# Patient Record
Sex: Female | Born: 1940 | Race: White | Hispanic: No
Health system: Southern US, Community
[De-identification: ages and names within clinical notes are randomized; demographics above are authoritative.]

## PROBLEM LIST (undated history)

## (undated) DIAGNOSIS — J449 Chronic obstructive pulmonary disease, unspecified: Secondary | ICD-10-CM

## (undated) DIAGNOSIS — J9621 Acute and chronic respiratory failure with hypoxia: Secondary | ICD-10-CM

## (undated) DIAGNOSIS — J181 Lobar pneumonia, unspecified organism: Secondary | ICD-10-CM

## (undated) DIAGNOSIS — N183 Chronic kidney disease, stage 3 unspecified: Secondary | ICD-10-CM

---

## 2019-03-07 ENCOUNTER — Inpatient Hospital Stay
Admission: RE | Admit: 2019-03-07 | Discharge: 2019-04-04 | Disposition: A | Discharge status: Home Health | Payer: Medicare Other | Source: Other Acute Inpatient Hospital | Attending: Internal Medicine | Admitting: Internal Medicine

## 2019-03-07 ENCOUNTER — Other Ambulatory Visit (HOSPITAL_COMMUNITY): Payer: Medicare Other

## 2019-03-07 DIAGNOSIS — N183 Chronic kidney disease, stage 3 unspecified: Secondary | ICD-10-CM | POA: Diagnosis present

## 2019-03-07 DIAGNOSIS — T85598A Other mechanical complication of other gastrointestinal prosthetic devices, implants and grafts, initial encounter: Secondary | ICD-10-CM

## 2019-03-07 DIAGNOSIS — Z0189 Encounter for other specified special examinations: Secondary | ICD-10-CM

## 2019-03-07 DIAGNOSIS — J449 Chronic obstructive pulmonary disease, unspecified: Secondary | ICD-10-CM | POA: Diagnosis present

## 2019-03-07 DIAGNOSIS — J9690 Respiratory failure, unspecified, unspecified whether with hypoxia or hypercapnia: Secondary | ICD-10-CM

## 2019-03-07 DIAGNOSIS — J9621 Acute and chronic respiratory failure with hypoxia: Secondary | ICD-10-CM | POA: Diagnosis present

## 2019-03-07 DIAGNOSIS — K567 Ileus, unspecified: Secondary | ICD-10-CM

## 2019-03-07 DIAGNOSIS — J181 Lobar pneumonia, unspecified organism: Secondary | ICD-10-CM | POA: Diagnosis present

## 2019-03-07 HISTORY — DX: Chronic kidney disease, stage 3 unspecified: N18.30

## 2019-03-07 HISTORY — DX: Acute and chronic respiratory failure with hypoxia: J96.21

## 2019-03-07 HISTORY — DX: Lobar pneumonia, unspecified organism: J18.1

## 2019-03-07 HISTORY — DX: Chronic obstructive pulmonary disease, unspecified: J44.9

## 2019-03-07 MED ORDER — METHYLPREDNISOLONE SODIUM SUCC 40 MG IJ SOLR
40.00 | INTRAMUSCULAR | Status: DC
Start: 2019-03-08 — End: 2019-03-07

## 2019-03-07 MED ORDER — GENERIC EXTERNAL MEDICATION
Status: DC
Start: ? — End: 2019-03-07

## 2019-03-07 MED ORDER — ALBUTEROL SULFATE (2.5 MG/3ML) 0.083% IN NEBU
2.50 | INHALATION_SOLUTION | RESPIRATORY_TRACT | Status: DC
Start: 2019-03-07 — End: 2019-03-07

## 2019-03-07 MED ORDER — ENOXAPARIN SODIUM 40 MG/0.4ML ~~LOC~~ SOLN
40.00 | SUBCUTANEOUS | Status: DC
Start: ? — End: 2019-03-07

## 2019-03-07 MED ORDER — SODIUM BICARBONATE 650 MG PO TABS
650.00 | ORAL_TABLET | ORAL | Status: DC
Start: 2019-03-07 — End: 2019-03-07

## 2019-03-07 MED ORDER — GENERIC EXTERNAL MEDICATION
0.08 | Status: DC
Start: ? — End: 2019-03-07

## 2019-03-07 MED ORDER — BISACODYL 10 MG RE SUPP
10.00 | RECTAL | Status: DC
Start: 2019-03-07 — End: 2019-03-07

## 2019-03-07 MED ORDER — POLYETHYLENE GLYCOL 3350 17 G PO PACK
17.00 | PACK | ORAL | Status: DC
Start: ? — End: 2019-03-07

## 2019-03-07 MED ORDER — GABAPENTIN 100 MG PO CAPS
200.00 | ORAL_CAPSULE | ORAL | Status: DC
Start: 2019-03-07 — End: 2019-03-07

## 2019-03-07 MED ORDER — BUDESONIDE 0.5 MG/2ML IN SUSP
0.50 | RESPIRATORY_TRACT | Status: DC
Start: 2019-03-07 — End: 2019-03-07

## 2019-03-07 MED ORDER — SODIUM CHLORIDE 0.9 % IV SOLN
10.00 | INTRAVENOUS | Status: DC
Start: ? — End: 2019-03-07

## 2019-03-07 MED ORDER — ACETAMINOPHEN 650 MG RE SUPP
650.00 | RECTAL | Status: DC
Start: ? — End: 2019-03-07

## 2019-03-07 MED ORDER — LEVOTHYROXINE SODIUM 100 MCG PO TABS
100.00 | ORAL_TABLET | ORAL | Status: DC
Start: 2019-03-08 — End: 2019-03-07

## 2019-03-07 MED ORDER — FENTANYL CITRATE-NACL 1-0.9 MG/100ML-% IV SOLN
50.00 | INTRAVENOUS | Status: DC
Start: ? — End: 2019-03-07

## 2019-03-07 MED ORDER — ALBUTEROL SULFATE (2.5 MG/3ML) 0.083% IN NEBU
2.50 | INHALATION_SOLUTION | RESPIRATORY_TRACT | Status: DC
Start: ? — End: 2019-03-07

## 2019-03-07 MED ORDER — ARFORMOTEROL TARTRATE 15 MCG/2ML IN NEBU
15.00 | INHALATION_SOLUTION | RESPIRATORY_TRACT | Status: DC
Start: 2019-03-07 — End: 2019-03-07

## 2019-03-07 MED ORDER — GENERIC EXTERNAL MEDICATION
1.00 | Status: DC
Start: 2019-03-07 — End: 2019-03-07

## 2019-03-07 MED ORDER — GENERIC EXTERNAL MEDICATION
0.04 | Status: DC
Start: ? — End: 2019-03-07

## 2019-03-07 MED ORDER — ACETAMINOPHEN 160 MG/5ML PO LIQD
650.00 | ORAL | Status: DC
Start: ? — End: 2019-03-07

## 2019-03-07 MED ORDER — PAROXETINE HCL 10 MG PO TABS
10.00 | ORAL_TABLET | ORAL | Status: DC
Start: 2019-03-08 — End: 2019-03-07

## 2019-03-07 MED ORDER — MUPIROCIN 2 % EX OINT
TOPICAL_OINTMENT | CUTANEOUS | Status: DC
Start: 2019-03-07 — End: 2019-03-07

## 2019-03-07 MED ORDER — ACETAMINOPHEN 325 MG PO TABS
650.00 | ORAL_TABLET | ORAL | Status: DC
Start: ? — End: 2019-03-07

## 2019-03-07 MED ORDER — PROPOFOL 200 MG/20ML IV EMUL
5.00 | INTRAVENOUS | Status: DC
Start: ? — End: 2019-03-07

## 2019-03-08 ENCOUNTER — Other Ambulatory Visit (HOSPITAL_COMMUNITY): Payer: Medicare Other

## 2019-03-08 ENCOUNTER — Encounter: Payer: Self-pay | Admitting: Internal Medicine

## 2019-03-08 DIAGNOSIS — J449 Chronic obstructive pulmonary disease, unspecified: Secondary | ICD-10-CM | POA: Diagnosis present

## 2019-03-08 DIAGNOSIS — J181 Lobar pneumonia, unspecified organism: Secondary | ICD-10-CM

## 2019-03-08 DIAGNOSIS — J9621 Acute and chronic respiratory failure with hypoxia: Secondary | ICD-10-CM | POA: Diagnosis not present

## 2019-03-08 DIAGNOSIS — N183 Chronic kidney disease, stage 3 unspecified: Secondary | ICD-10-CM | POA: Diagnosis present

## 2019-03-08 LAB — CBC WITH DIFFERENTIAL/PLATELET
Abs Immature Granulocytes: 0.1 10*3/uL — ABNORMAL HIGH (ref 0.00–0.07)
Basophils Absolute: 0 10*3/uL (ref 0.0–0.1)
Basophils Relative: 0 %
Eosinophils Absolute: 0 10*3/uL (ref 0.0–0.5)
Eosinophils Relative: 0 %
HCT: 25.1 % — ABNORMAL LOW (ref 36.0–46.0)
Hemoglobin: 7.8 g/dL — ABNORMAL LOW (ref 12.0–15.0)
Lymphocytes Relative: 10 %
Lymphs Abs: 0.5 10*3/uL — ABNORMAL LOW (ref 0.7–4.0)
MCH: 30.5 pg (ref 26.0–34.0)
MCHC: 31.1 g/dL (ref 30.0–36.0)
MCV: 98 fL (ref 80.0–100.0)
Metamyelocytes Relative: 1 %
Monocytes Absolute: 0.1 10*3/uL (ref 0.1–1.0)
Monocytes Relative: 1 %
Neutro Abs: 4.6 10*3/uL (ref 1.7–7.7)
Neutrophils Relative %: 88 %
Platelets: 134 10*3/uL — ABNORMAL LOW (ref 150–400)
RBC: 2.56 MIL/uL — ABNORMAL LOW (ref 3.87–5.11)
RDW: 13.6 % (ref 11.5–15.5)
WBC: 5.2 10*3/uL (ref 4.0–10.5)
nRBC: 0.8 % — ABNORMAL HIGH (ref 0.0–0.2)
nRBC: 4 /100 WBC — ABNORMAL HIGH

## 2019-03-08 LAB — COMPREHENSIVE METABOLIC PANEL
ALT: 18 U/L (ref 0–44)
AST: 13 U/L — ABNORMAL LOW (ref 15–41)
Albumin: 2.6 g/dL — ABNORMAL LOW (ref 3.5–5.0)
Alkaline Phosphatase: 34 U/L — ABNORMAL LOW (ref 38–126)
Anion gap: 6 (ref 5–15)
BUN: 59 mg/dL — ABNORMAL HIGH (ref 8–23)
CO2: 20 mmol/L — ABNORMAL LOW (ref 22–32)
Calcium: 8.7 mg/dL — ABNORMAL LOW (ref 8.9–10.3)
Chloride: 115 mmol/L — ABNORMAL HIGH (ref 98–111)
Creatinine, Ser: 1.8 mg/dL — ABNORMAL HIGH (ref 0.44–1.00)
GFR calc Af Amer: 31 mL/min — ABNORMAL LOW (ref >60)
GFR calc non Af Amer: 26 mL/min — ABNORMAL LOW (ref >60)
Glucose, Bld: 110 mg/dL — ABNORMAL HIGH (ref 70–99)
Potassium: 5.2 mmol/L — ABNORMAL HIGH (ref 3.5–5.1)
Sodium: 141 mmol/L (ref 135–145)
Total Bilirubin: 0.4 mg/dL (ref 0.3–1.2)
Total Protein: 5.2 g/dL — ABNORMAL LOW (ref 6.5–8.1)

## 2019-03-08 LAB — BLOOD GAS, ARTERIAL
Acid-base deficit: 3.7 mmol/L — ABNORMAL HIGH (ref 0.0–2.0)
Bicarbonate: 22.2 mmol/L (ref 20.0–28.0)
FIO2: 40
MECHVT: 360 mL
O2 Saturation: 98.3 %
PEEP: 5 cmH2O
Patient temperature: 98.4
RATE: 16 resp/min
pCO2 arterial: 49.6 mmHg — ABNORMAL HIGH (ref 32.0–48.0)
pH, Arterial: 7.272 — ABNORMAL LOW (ref 7.350–7.450)
pO2, Arterial: 121 mmHg — ABNORMAL HIGH (ref 83.0–108.0)

## 2019-03-08 LAB — PHOSPHORUS: Phosphorus: 3.8 mg/dL (ref 2.5–4.6)

## 2019-03-08 LAB — MAGNESIUM: Magnesium: 2.4 mg/dL (ref 1.7–2.4)

## 2019-03-08 LAB — PROTIME-INR
INR: 1.1 (ref 0.8–1.2)
Prothrombin Time: 14.1 seconds (ref 11.4–15.2)

## 2019-03-08 LAB — APTT: aPTT: 29 seconds (ref 24–36)

## 2019-03-08 NOTE — Consult Note (Signed)
[facility]  Date of Service: 03/08/2019  PULMONARY CRITICAL CARE CONSULT   Angela Dorylice Cassell Lease  ZOX:[id number]  DOB: 01/11/1941   DOA: 03/07/2019  Referring Physician: Carron CurieAli Hijazi, MD  HPI: Angela Waters is a 78 y.o. female seen for follow up of Acute on Chronic Respiratory Failure.  Patient transferred for further management and weaning.  Presented to the other hospital with pneumonia was felt to be possibly due to COVID-19.  Patient also was having significant encephalopathy elevated troponins and has a history of GERD hypertension hyperlipidemia hypothyroidism.  Basically patient was found to have altered mental status and brought into the hospital had been also having emesis for about 2 days prior to admission.  In the ED patient was noted to be somewhat hypotensive and chest x-ray revealed expiratory wheezes and rales.  In addition there was patchy lower lobe infiltrate and patient was suspected to have COVID-19 which was later ruled out.  Other complications included chronic kidney disease felt to be stage III patient did have a history of COPD along with history of tobacco use.  At this time patient is intubated on the ventilator remains on assist control mode right now with a PEEP of 5  Review of Systems:  ROS performed and is unremarkable other than noted above.  Past Medical History:  Diagnosis Date  ?Marland Kitchen. Anemia  pern  ?Marland Kitchen. Arthritis  ?Marland Kitchen. COPD (chronic obstructive pulmonary disease) (*)  ?Marland Kitchen. Depression  ?Marland Kitchen. GERD (gastroesophageal reflux disease)  ?Marland Kitchen. Hyperlipidemia  ?Marland Kitchen. Hypertension  ?Marland Kitchen. On supplemental oxygen therapy  if SPO2 is under 90  ?. OSA (obstructive sleep apnea)  with CPAC, setting is 17  ?Marland Kitchen. Restless legs syndrome (RLS)  ?. Seasonal allergies  ?. Stroke (*)  4 years ago, per pt, no deficit from stroke  ?Marland Kitchen. Thyroid disease  hypothyroidism  ?Marland Kitchen. Tremor   Past Surgical History:  Procedure Laterality  Date  ?Marland Kitchen. Appendectomy  ?. Back surgery  x4 plates and screws  ?. Carpal tunnel release  ?Marland Kitchen. Cesarean section  ?Marland Kitchen. Eye implants  ?Marland Kitchen. Eye surgery 2011  bilateral cataract with lens implants  ?Marland Kitchen. Femur fracture surgery Left 09/11/2017  [facility]-Dr. Kristen LoaderFurr  ?Marland Kitchen. Finger surgery Right 07/25/13  [place] ring finger  ?Marland Kitchen. Heel spur surgery  ?Marland Kitchen. Hip arthroplasty  ?Marland Kitchen. Hip surgery  ?Marland Kitchen. Hysterectomy  ?. Joint replacement  bilateral knee replacement  ?. Knee arthrocentesis  BILATERAL REPLACEMENT  ?Marland Kitchen. Shoulder surgery 05/04/12  right  ?Marland Kitchen. Tonsillectomy  ?Marland Kitchen. Total knee arthroplasty Bilateral   Allergies  Allergen Reactions  ?Marland Kitchen. Adhesive [Contact Dermatitis] Rash  ?. Penicillins Hives  **Tolerated zosyn during 04/30/17 admission with no issues or reactions noted.  ?. Latex Swelling  ?Marland Kitchen. Sulfa Antibiotics Hives    Medications: Reviewed on Rounds  Physical Exam:  Vitals: Temperature 98.0 pulse 50 respiratory rate 16 blood pressure 125/55 saturations 99%  Ventilator Settings mode ventilation assist control FiO2 28% tidal volume 513 PEEP 5  . General: Comfortable at this time . Eyes: Grossly normal lids, irises & conjunctiva . ENT: grossly tongue is normal . Neck: no obvious mass . Cardiovascular: S1-S2 normal no gallop or rub . Respiratory: No rhonchi no rales are noted at this time . Abdomen: Soft and nontender . Skin: no rash seen on limited exam . Musculoskeletal: not rigid . Psychiatric:unable to assess . Neurologic: no seizure no involuntary movements         Labs on Admission:  Basic  Metabolic Panel: Recent Labs  Lab 03/08/19 0514  NA 141  K 5.2*  CL 115*  CO2 20*  GLUCOSE 110*  BUN 59*  CREATININE 1.80*  CALCIUM 8.7*  MG 2.4  PHOS 3.8    Recent Labs  Lab 03/08/19 0453  PHART 7.272*  PCO2ART 49.6*  PO2ART 121*  HCO3 22.2  O2SAT 98.3    Liver Function Tests: Recent Labs  Lab 03/08/19 0514  AST 13*  ALT 18  ALKPHOS 34*  BILITOT 0.4  PROT 5.2*  ALBUMIN 2.6*   No  results for input(s): LIPASE, AMYLASE in the last 168 hours. No results for input(s): AMMONIA in the last 168 hours.  CBC: Recent Labs  Lab 03/08/19 0514  WBC 5.2  NEUTROABS 4.6  HGB 7.8*  HCT 25.1*  MCV 98.0  PLT 134*    Cardiac Enzymes: No results for input(s): CKTOTAL, CKMB, CKMBINDEX, TROPONINI in the last 168 hours.  BNP (last 3 results) No results for input(s): BNP in the last 8760 hours.  ProBNP (last 3 results) No results for input(s): PROBNP in the last 8760 hours.   Radiological Exams on Admission: Dg Chest Port 1 View  Result Date: 03/07/2019 CLINICAL DATA:  78 year old female with respiratory failure status post NG tube placement. EXAM: PORTABLE CHEST 1 VIEW COMPARISON:  None. FINDINGS: Endotracheal tube with tip approximately 3.5 cm above the carina. A feeding tube extends down over the mediastinum with tip at or just distal to the expected location of the GE junction. Recommend further advancing of the tube by additional 7-10 cm. An additional radiopaque line over the mediastinum to the right of the midthoracic spine noted which is indeterminate. Repeat radiograph of the chest and abdomen after repositioning of the feeding tube is recommended. There are bibasilar atelectatic changes. Trace bilateral pleural effusions may be present. No pneumothorax. There is mild cardiomegaly. Atherosclerotic calcification of the aorta. Osteopenia with degenerative changes of the spine. Cervical spine and lumbar spine fusion hardware. No acute osseous pathology. IMPRESSION: 1. Endotracheal tube above the carina. 2. Feeding tube with tip at or just distal to the expected location of the GE junction. Recommend further advancing the tube by additional 7-10 cm. 3. Mild cardiomegaly. Probable trace bilateral pleural effusions. Electronically Signed   By: Elgie CollardArash  Radparvar M.D.   On: 03/07/2019 22:56   Dg Abd Portable 1v  Result Date: 03/08/2019 CLINICAL DATA:  OG tube placement. EXAM: PORTABLE  ABDOMEN - 1 VIEW COMPARISON:  Radiographs earlier this day. FINDINGS: Tip and side port of the enteric tube below the diaphragm in the stomach. The previous esophageal temperature probe is been removed. Endotracheal tube tip above the carina. Nonobstructive bowel gas pattern. IMPRESSION: Tip and side port of the enteric tube below the diaphragm in the stomach. Electronically Signed   By: Narda RutherfordMelanie  Sanford M.D.   On: 03/08/2019 02:13   Dg Abd Portable 1v  Result Date: 03/08/2019 CLINICAL DATA:  OG tube placement. EXAM: PORTABLE ABDOMEN - 1 VIEW COMPARISON:  Radiograph yesterday. FINDINGS: Tip of the enteric tube is in the region of the distal esophagus, side-port in the mid chest. Recommend advancement of least 10 cm. Additional esophageal stylette or temperature probe tip is in the stomach. Increasing heterogeneous lower lobe opacities. IMPRESSION: Tip of the enteric tube in the distal esophagus, side-port in the mid esophagus. Recommend advancement of least 10 cm. Additional esophageal stylette or temperature probe tip is in the stomach. Electronically Signed   By: Narda RutherfordMelanie  Sanford M.D.   On:  03/08/2019 00:33   Dg Abd Portable 1v  Result Date: 03/07/2019 CLINICAL DATA:  78 year old female with respiratory failure status post NG tube placement. EXAM: PORTABLE CHEST 1 VIEW COMPARISON:  None. FINDINGS: Endotracheal tube with tip approximately 3.5 cm above the carina. A feeding tube extends down over the mediastinum with tip at or just distal to the expected location of the GE junction. Recommend further advancing of the tube by additional 7-10 cm. An additional radiopaque line over the mediastinum to the right of the midthoracic spine noted which is indeterminate. Repeat radiograph of the chest and abdomen after repositioning of the feeding tube is recommended. There are bibasilar atelectatic changes. Trace bilateral pleural effusions may be present. No pneumothorax. There is mild cardiomegaly. Atherosclerotic  calcification of the aorta. Osteopenia with degenerative changes of the spine. Cervical spine and lumbar spine fusion hardware. No acute osseous pathology. IMPRESSION: 1. Endotracheal tube above the carina. 2. Feeding tube with tip at or just distal to the expected location of the GE junction. Recommend further advancing the tube by additional 7-10 cm. 3. Mild cardiomegaly. Probable trace bilateral pleural effusions. Electronically Signed   By: Anner Crete M.D.   On: 03/07/2019 22:56    Assessment/Plan Active Problems:   Acute on chronic respiratory failure with hypoxia (HCC)   COPD, severe (HCC)   Chronic kidney disease, stage III (moderate) (HCC)   Lobar pneumonia, unspecified organism ([place])   1. Acute on chronic respiratory failure with hypoxia patient was diagnosed with acute lower lobe pneumonia patient was treated with antibiotics right now the latest chest x-ray reveals cardiomegaly and trace effusions.  We will continue to follow this along radiologically.  Patient son currently on assist control mode will try to switch over to pressure support and try to wean. 2. COPD assumed severe disease we will continue with supportive care patient had been on steroids and also has completed antibiotics. 3. Chronic kidney disease stage III we will follow-up on labs monitor fluid status. 4. Lobar pneumonia treated with antibiotics follow-up radiologically.  Patient has been ruled out for COVID-19. 5. Possible Parkinson's patient is on carbidopa levodopa  I have personally seen and evaluated the patient, evaluated laboratory and imaging results, formulated the assessment and plan and placed orders. The Patient requires high complexity decision making for assessment and support.  Case was discussed on Rounds with the Respiratory Therapy Staff Time Spent 76minutes  Allyne Gee, MD [facility] Pulmonary Critical Care Medicine Sleep Medicine

## 2019-03-09 ENCOUNTER — Other Ambulatory Visit (HOSPITAL_COMMUNITY): Payer: Medicare Other

## 2019-03-09 DIAGNOSIS — J9621 Acute and chronic respiratory failure with hypoxia: Secondary | ICD-10-CM | POA: Diagnosis not present

## 2019-03-09 DIAGNOSIS — J449 Chronic obstructive pulmonary disease, unspecified: Secondary | ICD-10-CM | POA: Diagnosis not present

## 2019-03-09 DIAGNOSIS — J181 Lobar pneumonia, unspecified organism: Secondary | ICD-10-CM | POA: Diagnosis not present

## 2019-03-09 DIAGNOSIS — N183 Chronic kidney disease, stage 3 (moderate): Secondary | ICD-10-CM | POA: Diagnosis not present

## 2019-03-09 LAB — RENAL FUNCTION PANEL
Albumin: 3 g/dL — ABNORMAL LOW (ref 3.5–5.0)
Anion gap: 7 (ref 5–15)
BUN: 55 mg/dL — ABNORMAL HIGH (ref 8–23)
CO2: 22 mmol/L (ref 22–32)
Calcium: 9 mg/dL (ref 8.9–10.3)
Chloride: 112 mmol/L — ABNORMAL HIGH (ref 98–111)
Creatinine, Ser: 1.64 mg/dL — ABNORMAL HIGH (ref 0.44–1.00)
GFR calc Af Amer: 34 mL/min — ABNORMAL LOW
GFR calc non Af Amer: 30 mL/min — ABNORMAL LOW
Glucose, Bld: 128 mg/dL — ABNORMAL HIGH (ref 70–99)
Phosphorus: 3.6 mg/dL (ref 2.5–4.6)
Potassium: 4.7 mmol/L (ref 3.5–5.1)
Sodium: 141 mmol/L (ref 135–145)

## 2019-03-09 LAB — BLOOD GAS, ARTERIAL
Acid-base deficit: 2.4 mmol/L — ABNORMAL HIGH (ref 0.0–2.0)
Bicarbonate: 23.2 mmol/L (ref 20.0–28.0)
FIO2: 28
MECHVT: 400 mL
O2 Saturation: 94.3 %
PEEP: 5 cmH2O
Patient temperature: 98.6
RATE: 18 resp/min
pCO2 arterial: 50.3 mmHg — ABNORMAL HIGH (ref 32.0–48.0)
pH, Arterial: 7.286 — ABNORMAL LOW (ref 7.350–7.450)
pO2, Arterial: 79.2 mmHg — ABNORMAL LOW (ref 83.0–108.0)

## 2019-03-09 LAB — CBC
HCT: 29.4 % — ABNORMAL LOW (ref 36.0–46.0)
Hemoglobin: 9.2 g/dL — ABNORMAL LOW (ref 12.0–15.0)
MCH: 29.9 pg (ref 26.0–34.0)
MCHC: 31.3 g/dL (ref 30.0–36.0)
MCV: 95.5 fL (ref 80.0–100.0)
Platelets: 172 10*3/uL (ref 150–400)
RBC: 3.08 MIL/uL — ABNORMAL LOW (ref 3.87–5.11)
RDW: 13.4 % (ref 11.5–15.5)
WBC: 8 10*3/uL (ref 4.0–10.5)
nRBC: 0.4 % — ABNORMAL HIGH (ref 0.0–0.2)

## 2019-03-09 LAB — HEMOGLOBIN A1C
Hgb A1c MFr Bld: 5.5 % (ref 4.8–5.6)
Mean Plasma Glucose: 111.15 mg/dL

## 2019-03-09 LAB — MAGNESIUM: Magnesium: 2.4 mg/dL (ref 1.7–2.4)

## 2019-03-09 LAB — TSH: TSH: 6.894 u[IU]/mL — ABNORMAL HIGH (ref 0.350–4.500)

## 2019-03-09 MED ORDER — GENERIC EXTERNAL MEDICATION
Status: DC
Start: ? — End: 2019-03-09

## 2019-03-09 NOTE — Progress Notes (Addendum)
[facility]ULMON[facility]ITICAL [facility]ARE SERVICE  PROGRESS NOTE  Date of Service: 03/09/2019  Angela Dorylice Cassell Manalang  ZOX:[id number]:[id number]  DOB: 07/06/1941   DOA: 03/07/2019  Referring Physician: Carron CurieAli Hijazi, MD  HPI: Angela Waters is a 78 y.o. female seen for follow up of Acute on Chronic Respiratory Failure.  Patient has an 8-hour goal on pressure support 12/5 FiO2 of 28%.  Patient had a well with no distress this time.  Medications: Reviewed on Rounds  Physical Exam:  Vitals: Pulse 74 respirations 18 BP 141/72 O2 sat 100% 98.7  Ventilator Settings pressure support 12 over 5 FiO2 28%  . General: Comfortable at this time . Eyes: Grossly normal lids, irises & conjunctiva . ENT: grossly tongue is normal . Neck: no obvious mass . Cardiovascular: S1 S2 normal no gallop . Respiratory: No rales or rhonchi noted . Abdomen: soft . Skin: no rash seen on limited exam . Musculoskeletal: not rigid . Psychiatric:unable to assess . Neurologic: no seizure no involuntary movements         Lab Data:   Basic Metabolic Panel: Recent Labs  Lab 03/08/19 0514 03/09/19 0426  NA 141 141  K 5.2* 4.7  CL 115* 112*  CO2 20* 22  GLUCOSE 110* 128*  BUN 59* 55*  CREATININE 1.80* 1.64*  CALCIUM 8.7* 9.0  MG 2.4 2.4  PHOS 3.8 3.6    ABG: Recent Labs  Lab 03/08/19 0453 03/09/19 0530  PHART 7.272* 7.286*  PCO2ART 49.6* 50.3*  PO2ART 121* 79.2*  HCO3 22.2 23.2  O2SAT 98.3 94.3    Liver Function Tests: Recent Labs  Lab 03/08/19 0514 03/09/19 0426  AST 13*  --   ALT 18  --   ALKPHOS 34*  --   BILITOT 0.4  --   PROT 5.2*  --   ALBUMIN 2.6* 3.0*   No results for input(s): LIPASE, AMYLASE in the last 168 hours. No results for input(s): AMMONIA in the last 168 hours.  CBC: Recent Labs  Lab 03/08/19 0514 03/09/19 0426  WBC 5.2 8.0  NEUTROABS 4.6  --   HGB 7.8* 9.2*  HCT 25.1* 29.4*  MCV 98.0 95.5  PLT 134* 172    Cardiac  Enzymes: No results for input(s): CKTOTAL, CKMB, CKMBINDEX, TROPONINI in the last 168 hours.  BNP (last 3 results) No results for input(s): BNP in the last 8760 hours.  ProBNP (last 3 results) No results for input(s): PROBNP in the last 8760 hours.  Radiological Exams: Dg Chest Port 1 View  Result Date: 03/09/2019 CLINICAL DATA:  Intubated patient admitted 03/07/2019 with acute on chronic respiratory failure. EXAM: PORTABLE CHEST 1 VIEW COMPARISON:  Single-view of the chest 03/07/2019. FINDINGS: Endotracheal tube and NG tube remain in place in good position. Small right effusion and basilar airspace disease have somewhat worsened since the prior examination. Mild, patchy left basilar airspace disease is unchanged. No pneumothorax. There is cardiomegaly but no pulmonary edema. Atherosclerosis noted. IMPRESSION: Some increase in a small right pleural effusion and basilar airspace disease which could be atelectasis or pneumonia. No change in streaky left basilar opacities with an appearance most compatible with atelectasis. Cardiomegaly. Atherosclerosis. Electronically Signed   By: Drusilla Kannerhomas  Dalessio M.D.   On: 03/09/2019 07:15   Dg Chest Port 1 View  Result Date: 03/07/2019 CLINICAL DATA:  78 year old female with respiratory failure status post NG tube placement. EXAM: PORTABLE CHEST 1 VIEW COMPARISON:  None. FINDINGS: Endotracheal tube with tip approximately  3.5 cm above the carina. A feeding tube extends down over the mediastinum with tip at or just distal to the expected location of the GE junction. Recommend further advancing of the tube by additional 7-10 cm. An additional radiopaque line over the mediastinum to the right of the midthoracic spine noted which is indeterminate. Repeat radiograph of the chest and abdomen after repositioning of the feeding tube is recommended. There are bibasilar atelectatic changes. Trace bilateral pleural effusions may be present. No pneumothorax. There is mild  cardiomegaly. Atherosclerotic calcification of the aorta. Osteopenia with degenerative changes of the spine. Cervical spine and lumbar spine fusion hardware. No acute osseous pathology. IMPRESSION: 1. Endotracheal tube above the carina. 2. Feeding tube with tip at or just distal to the expected location of the GE junction. Recommend further advancing the tube by additional 7-10 cm. 3. Mild cardiomegaly. Probable trace bilateral pleural effusions. Electronically Signed   By: Anner Crete M.D.   On: 03/07/2019 22:56   Dg Abd Portable 1v  Result Date: 03/08/2019 CLINICAL DATA:  OG tube placement. EXAM: PORTABLE ABDOMEN - 1 VIEW COMPARISON:  Radiographs earlier this day. FINDINGS: Tip and side port of the enteric tube below the diaphragm in the stomach. The previous esophageal temperature probe is been removed. Endotracheal tube tip above the carina. Nonobstructive bowel gas pattern. IMPRESSION: Tip and side port of the enteric tube below the diaphragm in the stomach. Electronically Signed   By: Keith Rake M.D.   On: 03/08/2019 02:13   Dg Abd Portable 1v  Result Date: 03/08/2019 CLINICAL DATA:  OG tube placement. EXAM: PORTABLE ABDOMEN - 1 VIEW COMPARISON:  Radiograph yesterday. FINDINGS: Tip of the enteric tube is in the region of the distal esophagus, side-port in the mid chest. Recommend advancement of least 10 cm. Additional esophageal stylette or temperature probe tip is in the stomach. Increasing heterogeneous lower lobe opacities. IMPRESSION: Tip of the enteric tube in the distal esophagus, side-port in the mid esophagus. Recommend advancement of least 10 cm. Additional esophageal stylette or temperature probe tip is in the stomach. Electronically Signed   By: Keith Rake M.D.   On: 03/08/2019 00:33   Dg Abd Portable 1v  Result Date: 03/07/2019 CLINICAL DATA:  78 year old female with respiratory failure status post NG tube placement. EXAM: PORTABLE CHEST 1 VIEW COMPARISON:  None.  FINDINGS: Endotracheal tube with tip approximately 3.5 cm above the carina. A feeding tube extends down over the mediastinum with tip at or just distal to the expected location of the GE junction. Recommend further advancing of the tube by additional 7-10 cm. An additional radiopaque line over the mediastinum to the right of the midthoracic spine noted which is indeterminate. Repeat radiograph of the chest and abdomen after repositioning of the feeding tube is recommended. There are bibasilar atelectatic changes. Trace bilateral pleural effusions may be present. No pneumothorax. There is mild cardiomegaly. Atherosclerotic calcification of the aorta. Osteopenia with degenerative changes of the spine. Cervical spine and lumbar spine fusion hardware. No acute osseous pathology. IMPRESSION: 1. Endotracheal tube above the carina. 2. Feeding tube with tip at or just distal to the expected location of the GE junction. Recommend further advancing the tube by additional 7-10 cm. 3. Mild cardiomegaly. Probable trace bilateral pleural effusions. Electronically Signed   By: Anner Crete M.D.   On: 03/07/2019 22:56    Assessment/Plan Active Problems:   Acute on chronic respiratory failure with hypoxia (HCC)   COPD, severe (HCC)   Chronic kidney disease,  stage III (moderate) (HCC)   Lobar pneumonia, unspecified organism (HCC)   1. Acute on chronic respiratory failure with hypoxia patient will continue with pressure support 12/5 and FiO2 of 20% for an 8-hour goal today.  We will continue to wean as tolerated per protocol.  Continue supportive measures. 2. COPD assumed severe disease we will continue with supportive care  3. Chronic kidney disease stage III we will follow-up on labs monitor fluid status. 4. Lobar pneumonia treated with antibiotics follow-up radiologically.  Patient has been ruled out for COVID-19. 5. Possible Parkinson's patient is on carbidopa levodopa   I have personally seen and evaluated  the patient, evaluated laboratory and imaging results, formulated the assessment and plan and placed orders. The Patient requires high complexity decision making for assessment and support.  Case was discussed on Rounds with the Respiratory Therapy Staff  Yevonne PaxSaadat A , MD [facility] Pulmonary Critical Care Medicine Sleep Medicine

## 2019-03-10 DIAGNOSIS — J181 Lobar pneumonia, unspecified organism: Secondary | ICD-10-CM | POA: Diagnosis not present

## 2019-03-10 DIAGNOSIS — J449 Chronic obstructive pulmonary disease, unspecified: Secondary | ICD-10-CM | POA: Diagnosis not present

## 2019-03-10 DIAGNOSIS — J9621 Acute and chronic respiratory failure with hypoxia: Secondary | ICD-10-CM | POA: Diagnosis not present

## 2019-03-10 DIAGNOSIS — N183 Chronic kidney disease, stage 3 (moderate): Secondary | ICD-10-CM | POA: Diagnosis not present

## 2019-03-10 LAB — RENAL FUNCTION PANEL
Albumin: 2.8 g/dL — ABNORMAL LOW (ref 3.5–5.0)
Anion gap: 13 (ref 5–15)
BUN: 51 mg/dL — ABNORMAL HIGH (ref 8–23)
CO2: 18 mmol/L — ABNORMAL LOW (ref 22–32)
Calcium: 9.5 mg/dL (ref 8.9–10.3)
Chloride: 112 mmol/L — ABNORMAL HIGH (ref 98–111)
Creatinine, Ser: 1.54 mg/dL — ABNORMAL HIGH (ref 0.44–1.00)
GFR calc Af Amer: 37 mL/min — ABNORMAL LOW (ref >60)
GFR calc non Af Amer: 32 mL/min — ABNORMAL LOW (ref >60)
Glucose, Bld: 111 mg/dL — ABNORMAL HIGH (ref 70–99)
Phosphorus: 3.5 mg/dL (ref 2.5–4.6)
Potassium: 5 mmol/L (ref 3.5–5.1)
Sodium: 143 mmol/L (ref 135–145)

## 2019-03-10 LAB — CBC
HCT: 27 % — ABNORMAL LOW (ref 36.0–46.0)
Hemoglobin: 8.9 g/dL — ABNORMAL LOW (ref 12.0–15.0)
MCH: 30.8 pg (ref 26.0–34.0)
MCHC: 33 g/dL (ref 30.0–36.0)
MCV: 93.4 fL (ref 80.0–100.0)
Platelets: 164 10*3/uL (ref 150–400)
RBC: 2.89 MIL/uL — ABNORMAL LOW (ref 3.87–5.11)
RDW: 13.4 % (ref 11.5–15.5)
WBC: 8.4 10*3/uL (ref 4.0–10.5)
nRBC: 0.6 % — ABNORMAL HIGH (ref 0.0–0.2)

## 2019-03-10 LAB — MAGNESIUM: Magnesium: 2.3 mg/dL (ref 1.7–2.4)

## 2019-03-10 LAB — T4, FREE: Free T4: 0.65 ng/dL (ref 0.61–1.12)

## 2019-03-10 NOTE — Progress Notes (Addendum)
[facility]   PULMONARY CRITICAL CARE SERVICE  PROGRESS NOTE  Date of Service: 03/10/2019  Angela Waters  ZOX:[id number]  DOB: 09/23/1940   DOA: 03/07/2019  Referring Physician: Carron CurieAli Hijazi, MD  HPI: Angela Dorylice Cassell Staubs is a 78 y.o. female seen for follow up of Acute on Chronic Respiratory Failure.  Patient remains on pressure support for 12-hour goal today on 35% FiO2 satting well currently with no distress.  Medications: Reviewed on Rounds  Physical Exam:  Vitals: Pulse 75 respirations 16 BP 139/83 O2 sat 99% temp 98.6  Ventilator Settings pressure support 12/5 FiO2 35%  . General: Comfortable at this time . Eyes: Grossly normal lids, irises & conjunctiva . ENT: grossly tongue is normal . Neck: no obvious mass . Cardiovascular: S1 S2 normal no gallop . Respiratory: No rales or rhonchi noted . Abdomen: soft . Skin: no rash seen on limited exam . Musculoskeletal: not rigid . Psychiatric:unable to assess . Neurologic: no seizure no involuntary movements         Lab Data:   Basic Metabolic Panel: Recent Labs  Lab 03/08/19 0514 03/09/19 0426 03/10/19 0721  NA 141 141 143  K 5.2* 4.7 5.0  CL 115* 112* 112*  CO2 20* 22 18*  GLUCOSE 110* 128* 111*  BUN 59* 55* 51*  CREATININE 1.80* 1.64* 1.54*  CALCIUM 8.7* 9.0 9.5  MG 2.4 2.4 2.3  PHOS 3.8 3.6 3.5    ABG: Recent Labs  Lab 03/08/19 0453 03/09/19 0530  PHART 7.272* 7.286*  PCO2ART 49.6* 50.3*  PO2ART 121* 79.2*  HCO3 22.2 23.2  O2SAT 98.3 94.3    Liver Function Tests: Recent Labs  Lab 03/08/19 0514 03/09/19 0426 03/10/19 0721  AST 13*  --   --   ALT 18  --   --   ALKPHOS 34*  --   --   BILITOT 0.4  --   --   PROT 5.2*  --   --   ALBUMIN 2.6* 3.0* 2.8*   No results for input(s): LIPASE, AMYLASE in the last 168 hours. No results for input(s): AMMONIA in the last 168 hours.  CBC: Recent Labs  Lab 03/08/19 0514 03/09/19 0426  03/10/19 0721  WBC 5.2 8.0 8.4  NEUTROABS 4.6  --   --   HGB 7.8* 9.2* 8.9*  HCT 25.1* 29.4* 27.0*  MCV 98.0 95.5 93.4  PLT 134* 172 164    Cardiac Enzymes: No results for input(s): CKTOTAL, CKMB, CKMBINDEX, TROPONINI in the last 168 hours.  BNP (last 3 results) No results for input(s): BNP in the last 8760 hours.  ProBNP (last 3 results) No results for input(s): PROBNP in the last 8760 hours.  Radiological Exams: Dg Chest Port 1 View  Result Date: 03/09/2019 CLINICAL DATA:  Intubated patient admitted 03/07/2019 with acute on chronic respiratory failure. EXAM: PORTABLE CHEST 1 VIEW COMPARISON:  Single-view of the chest 03/07/2019. FINDINGS: Endotracheal tube and NG tube remain in place in good position. Small right effusion and basilar airspace disease have somewhat worsened since the prior examination. Mild, patchy left basilar airspace disease is unchanged. No pneumothorax. There is cardiomegaly but no pulmonary edema. Atherosclerosis noted. IMPRESSION: Some increase in a small right pleural effusion and basilar airspace disease which could be atelectasis or pneumonia. No change in streaky left basilar opacities with an appearance most compatible with atelectasis. Cardiomegaly. Atherosclerosis. Electronically Signed   By: Drusilla Kannerhomas  Dalessio M.D.   On: 03/09/2019 07:15  Assessment/Plan Active Problems:   Acute on chronic respiratory failure with hypoxia (HCC)   COPD, severe (HCC)   Chronic kidney disease, stage III (moderate) (HCC)   Lobar pneumonia, unspecified organism ([place])   1. Acute on chronic respiratory failure with hypoxia patient will continue with pressure support 12/5 and FiO2 of 28% for a 12-hour goal today.  We will continue to wean as tolerated per protocol.  Continue supportive measures. 2. COPD assumed severe disease we will continue with supportive care  3. Chronic kidney disease stage III we will follow-up on labs monitor fluid status. 4. Lobar pneumonia treated  with antibiotics follow-up radiologically. Patient has been ruled out for COVID-19. 5. Possible Parkinson's patient is on carbidopa levodopa   I have personally seen and evaluated the patient, evaluated laboratory and imaging results, formulated the assessment and plan and placed orders. The Patient requires high complexity decision making for assessment and support.  Case was discussed on Rounds with the Respiratory Therapy Staff  Allyne Gee, MD [facility] Pulmonary Critical Care Medicine Sleep Medicine

## 2019-03-11 DIAGNOSIS — J9621 Acute and chronic respiratory failure with hypoxia: Secondary | ICD-10-CM | POA: Diagnosis not present

## 2019-03-11 DIAGNOSIS — N183 Chronic kidney disease, stage 3 (moderate): Secondary | ICD-10-CM | POA: Diagnosis not present

## 2019-03-11 DIAGNOSIS — J181 Lobar pneumonia, unspecified organism: Secondary | ICD-10-CM | POA: Diagnosis not present

## 2019-03-11 DIAGNOSIS — J449 Chronic obstructive pulmonary disease, unspecified: Secondary | ICD-10-CM | POA: Diagnosis not present

## 2019-03-11 LAB — CBC
HCT: 23.8 % — ABNORMAL LOW (ref 36.0–46.0)
Hemoglobin: 7.4 g/dL — ABNORMAL LOW (ref 12.0–15.0)
MCH: 30.3 pg (ref 26.0–34.0)
MCHC: 31.1 g/dL (ref 30.0–36.0)
MCV: 97.5 fL (ref 80.0–100.0)
Platelets: 163 10*3/uL (ref 150–400)
RBC: 2.44 MIL/uL — ABNORMAL LOW (ref 3.87–5.11)
RDW: 13.4 % (ref 11.5–15.5)
WBC: 6.5 10*3/uL (ref 4.0–10.5)
nRBC: 0 % (ref 0.0–0.2)

## 2019-03-11 LAB — MAGNESIUM: Magnesium: 2.1 mg/dL (ref 1.7–2.4)

## 2019-03-11 LAB — CULTURE, RESPIRATORY W GRAM STAIN

## 2019-03-11 LAB — RENAL FUNCTION PANEL
Albumin: 2.4 g/dL — ABNORMAL LOW (ref 3.5–5.0)
Anion gap: 6 (ref 5–15)
BUN: 53 mg/dL — ABNORMAL HIGH (ref 8–23)
CO2: 24 mmol/L (ref 22–32)
Calcium: 8.9 mg/dL (ref 8.9–10.3)
Chloride: 109 mmol/L (ref 98–111)
Creatinine, Ser: 1.46 mg/dL — ABNORMAL HIGH (ref 0.44–1.00)
GFR calc Af Amer: 40 mL/min — ABNORMAL LOW (ref >60)
GFR calc non Af Amer: 34 mL/min — ABNORMAL LOW (ref >60)
Glucose, Bld: 121 mg/dL — ABNORMAL HIGH (ref 70–99)
Phosphorus: 3.2 mg/dL (ref 2.5–4.6)
Potassium: 4.6 mmol/L (ref 3.5–5.1)
Sodium: 139 mmol/L (ref 135–145)

## 2019-03-11 NOTE — Progress Notes (Addendum)
Pulmonary Critical Care Medicine [place]   PULMONARY CRITICAL CARE SERVICE  PROGRESS NOTE  Date of Service: 5/45/6256  Angela Waters  LSL:[id number]  DOB: 1941-02-26   DOA: 03/07/2019  Referring Physician: Merton Border, MD  HPI: Angela Waters is a 78 y.o. female seen for follow up of Acute on Chronic Respiratory Failure.  Patient has a 16-hour goal today on pressure support 12/5 with an FiO2 of 28% currently doing well with good saturation.  Medications: Reviewed on Rounds  Physical Exam:  Vitals: Pulse 68 respirations 18 BP 140/80 O2 sat 99% temp 98.5  Ventilator Settings support 12/5 FiO2 28  . General: Comfortable at this time . Eyes: Grossly normal lids, irises & conjunctiva . ENT: grossly tongue is normal . Neck: no obvious mass . Cardiovascular: S1 S2 normal no gallop . Respiratory: No rales or rhonchi noted . Abdomen: soft . Skin: no rash seen on limited exam . Musculoskeletal: not rigid . Psychiatric:unable to assess . Neurologic: no seizure no involuntary movements         Lab Data:   Basic Metabolic Panel: Recent Labs  Lab 03/08/19 0514 03/09/19 0426 03/10/19 0721 03/11/19 0545  NA 141 141 143 139  K 5.2* 4.7 5.0 4.6  CL 115* 112* 112* 109  CO2 20* 22 18* 24  GLUCOSE 110* 128* 111* 121*  BUN 59* 55* 51* 53*  CREATININE 1.80* 1.64* 1.54* 1.46*  CALCIUM 8.7* 9.0 9.5 8.9  MG 2.4 2.4 2.3 2.1  PHOS 3.8 3.6 3.5 3.2    ABG: Recent Labs  Lab 03/08/19 0453 03/09/19 0530  PHART 7.272* 7.286*  PCO2ART 49.6* 50.3*  PO2ART 121* 79.2*  HCO3 22.2 23.2  O2SAT 98.3 94.3    Liver Function Tests: Recent Labs  Lab 03/08/19 0514 03/09/19 0426 03/10/19 0721 03/11/19 0545  AST 13*  --   --   --   ALT 18  --   --   --   ALKPHOS 34*  --   --   --   BILITOT 0.4  --   --   --   PROT 5.2*  --   --   --   ALBUMIN 2.6* 3.0* 2.8* 2.4*   No results for input(s): LIPASE, AMYLASE in the last 168 hours. No results for input(s):  AMMONIA in the last 168 hours.  CBC: Recent Labs  Lab 03/08/19 0514 03/09/19 0426 03/10/19 0721 03/11/19 0545  WBC 5.2 8.0 8.4 6.5  NEUTROABS 4.6  --   --   --   HGB 7.8* 9.2* 8.9* 7.4*  HCT 25.1* 29.4* 27.0* 23.8*  MCV 98.0 95.5 93.4 97.5  PLT 134* 172 164 163    Cardiac Enzymes: No results for input(s): CKTOTAL, CKMB, CKMBINDEX, TROPONINI in the last 168 hours.  BNP (last 3 results) No results for input(s): BNP in the last 8760 hours.  ProBNP (last 3 results) No results for input(s): PROBNP in the last 8760 hours.  Radiological Exams: No results found.  Assessment/Plan Active Problems:   Acute on chronic respiratory failure with hypoxia (HCC)   COPD, severe (HCC)   Chronic kidney disease, stage III (moderate) (HCC)   Lobar pneumonia, unspecified organism ([place])   1. Acute on chronic respiratory failure with hypoxiapatient will continue with pressure support 12/5 and FiO2 of 28% for a 16-hour goal today. We will continue to wean as tolerated per protocol. Continue supportive measures. 2. COPD assumed severe disease we will continue with supportive care  3. Chronic  kidney disease stage III we will follow-up on labs monitor fluid status. 4. Lobar pneumonia treated with antibiotics follow-up radiologically. Patient has been ruled out for COVID-19. 5. Possible Parkinson's patient is on carbidopa levodopa   I have personally seen and evaluated the patient, evaluated laboratory and imaging results, formulated the assessment and plan and placed orders. The Patient requires high complexity decision making for assessment and support.  Case was discussed on Rounds with the Respiratory Therapy Staff  Yevonne PaxSaadat A Angela Cunanan, MD [facility] Pulmonary Critical Care Medicine Sleep Medicine

## 2019-03-12 DIAGNOSIS — J9621 Acute and chronic respiratory failure with hypoxia: Secondary | ICD-10-CM | POA: Diagnosis not present

## 2019-03-12 DIAGNOSIS — J181 Lobar pneumonia, unspecified organism: Secondary | ICD-10-CM | POA: Diagnosis not present

## 2019-03-12 DIAGNOSIS — N183 Chronic kidney disease, stage 3 (moderate): Secondary | ICD-10-CM | POA: Diagnosis not present

## 2019-03-12 DIAGNOSIS — J449 Chronic obstructive pulmonary disease, unspecified: Secondary | ICD-10-CM | POA: Diagnosis not present

## 2019-03-12 NOTE — Progress Notes (Addendum)
[facility]   PULMONARY CRITICAL CARE SERVICE  PROGRESS NOTE  Date of Service: 03/12/2019  Angela Dorylice Cassell Ascher  [id number]:[id number]  DOB: 01/06/1941   DOA: 03/07/2019  Referring Physician: Carron CurieAli Hijazi, MD  HPI: Angela Waters is a 78 y.o. female seen for follow up of Acute on Chronic Respiratory Failure.  Patient continues on pressure support 12/5 FiO2 28% satting well with no fever or distress.  Medications: Reviewed on Rounds  Physical Exam:  Vitals: Pulse 81 respiration 16 BP 113/52 O2 sat 100% temp 96.7  Ventilator Settings pressure for 12/5 FiO2 of 20%  . General: Comfortable at this time . Eyes: Grossly normal lids, irises & conjunctiva . ENT: grossly tongue is normal . Neck: no obvious mass . Cardiovascular: S1 S2 normal no gallop . Respiratory: No rales or rhonchi noted . Abdomen: soft . Skin: no rash seen on limited exam . Musculoskeletal: not rigid . Psychiatric:unable to assess . Neurologic: no seizure no involuntary movements         Lab Data:   Basic Metabolic Panel: Recent Labs  Lab 03/08/19 0514 03/09/19 0426 03/10/19 0721 03/11/19 0545  NA 141 141 143 139  K 5.2* 4.7 5.0 4.6  CL 115* 112* 112* 109  CO2 20* 22 18* 24  GLUCOSE 110* 128* 111* 121*  BUN 59* 55* 51* 53*  CREATININE 1.80* 1.64* 1.54* 1.46*  CALCIUM 8.7* 9.0 9.5 8.9  MG 2.4 2.4 2.3 2.1  PHOS 3.8 3.6 3.5 3.2    ABG: Recent Labs  Lab 03/08/19 0453 03/09/19 0530  PHART 7.272* 7.286*  PCO2ART 49.6* 50.3*  PO2ART 121* 79.2*  HCO3 22.2 23.2  O2SAT 98.3 94.3    Liver Function Tests: Recent Labs  Lab 03/08/19 0514 03/09/19 0426 03/10/19 0721 03/11/19 0545  AST 13*  --   --   --   ALT 18  --   --   --   ALKPHOS 34*  --   --   --   BILITOT 0.4  --   --   --   PROT 5.2*  --   --   --   ALBUMIN 2.6* 3.0* 2.8* 2.4*   No results for input(s): LIPASE, AMYLASE in the last 168 hours. No results for input(s): AMMONIA in the last  168 hours.  CBC: Recent Labs  Lab 03/08/19 0514 03/09/19 0426 03/10/19 0721 03/11/19 0545  WBC 5.2 8.0 8.4 6.5  NEUTROABS 4.6  --   --   --   HGB 7.8* 9.2* 8.9* 7.4*  HCT 25.1* 29.4* 27.0* 23.8*  MCV 98.0 95.5 93.4 97.5  PLT 134* 172 164 163    Cardiac Enzymes: No results for input(s): CKTOTAL, CKMB, CKMBINDEX, TROPONINI in the last 168 hours.  BNP (last 3 results) No results for input(s): BNP in the last 8760 hours.  ProBNP (last 3 results) No results for input(s): PROBNP in the last 8760 hours.  Radiological Exams: No results found.  Assessment/Plan Active Problems:   Acute on chronic respiratory failure with hypoxia (HCC)   COPD, severe (HCC)   Chronic kidney disease, stage III (moderate) (HCC)   Lobar pneumonia, unspecified organism (HCC)   1. Acute on chronic respiratory failure with hypoxiapatient will continue with pressure support 12/5 and FiO2 of 28%  As tolerated. We will continue to wean as tolerated per protocol. Continue supportive measures. 2. COPD assumed severe disease we will continue with supportive care  3. Chronic kidney disease stage III we will  follow-up on labs monitor fluid status. 4. Lobar pneumonia treated with antibiotics follow-up radiologically. Patient has been ruled out for COVID-19. 5. Possible Parkinson's patient is on carbidopa levodopa   I have personally seen and evaluated the patient, evaluated laboratory and imaging results, formulated the assessment and plan and placed orders. The Patient requires high complexity decision making for assessment and support.  Case was discussed on Rounds with the Respiratory Therapy Staff  Allyne Gee, MD [facility] Pulmonary Critical Care Medicine Sleep Medicine

## 2019-03-13 DIAGNOSIS — J181 Lobar pneumonia, unspecified organism: Secondary | ICD-10-CM | POA: Diagnosis not present

## 2019-03-13 DIAGNOSIS — J9621 Acute and chronic respiratory failure with hypoxia: Secondary | ICD-10-CM | POA: Diagnosis not present

## 2019-03-13 DIAGNOSIS — N183 Chronic kidney disease, stage 3 (moderate): Secondary | ICD-10-CM | POA: Diagnosis not present

## 2019-03-13 DIAGNOSIS — J449 Chronic obstructive pulmonary disease, unspecified: Secondary | ICD-10-CM | POA: Diagnosis not present

## 2019-03-13 LAB — RENAL FUNCTION PANEL
Albumin: 2.9 g/dL — ABNORMAL LOW (ref 3.5–5.0)
Anion gap: 7 (ref 5–15)
BUN: 44 mg/dL — ABNORMAL HIGH (ref 8–23)
CO2: 27 mmol/L (ref 22–32)
Calcium: 9.1 mg/dL (ref 8.9–10.3)
Chloride: 105 mmol/L (ref 98–111)
Creatinine, Ser: 1.36 mg/dL — ABNORMAL HIGH (ref 0.44–1.00)
GFR calc Af Amer: 43 mL/min — ABNORMAL LOW (ref >60)
GFR calc non Af Amer: 37 mL/min — ABNORMAL LOW (ref >60)
Glucose, Bld: 116 mg/dL — ABNORMAL HIGH (ref 70–99)
Phosphorus: 3.3 mg/dL (ref 2.5–4.6)
Potassium: 4.8 mmol/L (ref 3.5–5.1)
Sodium: 139 mmol/L (ref 135–145)

## 2019-03-13 LAB — CBC
HCT: 28.1 % — ABNORMAL LOW (ref 36.0–46.0)
Hemoglobin: 8.8 g/dL — ABNORMAL LOW (ref 12.0–15.0)
MCH: 30 pg (ref 26.0–34.0)
MCHC: 31.3 g/dL (ref 30.0–36.0)
MCV: 95.9 fL (ref 80.0–100.0)
Platelets: 193 10*3/uL (ref 150–400)
RBC: 2.93 MIL/uL — ABNORMAL LOW (ref 3.87–5.11)
RDW: 13.4 % (ref 11.5–15.5)
WBC: 9.2 10*3/uL (ref 4.0–10.5)
nRBC: 0 % (ref 0.0–0.2)

## 2019-03-13 LAB — MAGNESIUM: Magnesium: 2 mg/dL (ref 1.7–2.4)

## 2019-03-13 NOTE — Progress Notes (Signed)
[facility]   PULMONARY CRITICAL CARE SERVICE  PROGRESS NOTE  Date of Service: 03/13/2019  Angela Waters  UJW:[id number]:[id number]  DOB: 08/28/1940   DOA: 03/07/2019  Referring Physician: Carron CurieAli Hijazi, MD  HPI: Angela Waters is a 78 y.o. female seen for follow up of Acute on Chronic Respiratory Failure.  Patient currently is on pressure support mode good saturations are noted currently is on 12/5  Medications: Reviewed on Rounds  Physical Exam:  Vitals: Temperature 98.1 pulse 80 respiratory rate 13 blood pressure 112/62 saturations 95%  Ventilator Settings mode ventilation pressure support FiO2 28% pressure support 12 PEEP 5 tidal volume 500  . General: Comfortable at this time . Eyes: Grossly normal lids, irises & conjunctiva . ENT: grossly tongue is normal . Neck: no obvious mass . Cardiovascular: S1 S2 normal no gallop . Respiratory: No rhonchi no rales are noted at this time . Abdomen: soft . Skin: no rash seen on limited exam . Musculoskeletal: not rigid . Psychiatric:unable to assess . Neurologic: no seizure no involuntary movements         Lab Data:   Basic Metabolic Panel: Recent Labs  Lab 03/08/19 0514 03/09/19 0426 03/10/19 0721 03/11/19 0545 03/13/19 0637  NA 141 141 143 139 139  K 5.2* 4.7 5.0 4.6 4.8  CL 115* 112* 112* 109 105  CO2 20* 22 18* 24 27  GLUCOSE 110* 128* 111* 121* 116*  BUN 59* 55* 51* 53* 44*  CREATININE 1.80* 1.64* 1.54* 1.46* 1.36*  CALCIUM 8.7* 9.0 9.5 8.9 9.1  MG 2.4 2.4 2.3 2.1 2.0  PHOS 3.8 3.6 3.5 3.2 3.3    ABG: Recent Labs  Lab 03/08/19 0453 03/09/19 0530  PHART 7.272* 7.286*  PCO2ART 49.6* 50.3*  PO2ART 121* 79.2*  HCO3 22.2 23.2  O2SAT 98.3 94.3    Liver Function Tests: Recent Labs  Lab 03/08/19 0514 03/09/19 0426 03/10/19 0721 03/11/19 0545 03/13/19 0637  AST 13*  --   --   --   --   ALT 18  --   --   --   --   ALKPHOS 34*  --   --   --   --    BILITOT 0.4  --   --   --   --   PROT 5.2*  --   --   --   --   ALBUMIN 2.6* 3.0* 2.8* 2.4* 2.9*   No results for input(s): LIPASE, AMYLASE in the last 168 hours. No results for input(s): AMMONIA in the last 168 hours.  CBC: Recent Labs  Lab 03/08/19 0514 03/09/19 0426 03/10/19 0721 03/11/19 0545 03/13/19 0637  WBC 5.2 8.0 8.4 6.5 9.2  NEUTROABS 4.6  --   --   --   --   HGB 7.8* 9.2* 8.9* 7.4* 8.8*  HCT 25.1* 29.4* 27.0* 23.8* 28.1*  MCV 98.0 95.5 93.4 97.5 95.9  PLT 134* 172 164 163 193    Cardiac Enzymes: No results for input(s): CKTOTAL, CKMB, CKMBINDEX, TROPONINI in the last 168 hours.  BNP (last 3 results) No results for input(s): BNP in the last 8760 hours.  ProBNP (last 3 results) No results for input(s): PROBNP in the last 8760 hours.  Radiological Exams: No results found.  Assessment/Plan Active Problems:   Acute on chronic respiratory failure with hypoxia (HCC)   COPD, severe (HCC)   Chronic kidney disease, stage III (moderate) (HCC)   Lobar pneumonia, unspecified organism (HCC)  1. Acute on chronic respiratory failure with hypoxia we will continue with pressure support wean as tolerated patient is endotracheally intubated it is probably going to be long-term ventilation in the situation and so therefore would recommend doing a tracheostomy 2. Severe COPD patient is at baseline we will continue with supportive care 3. Chronic kidney disease stage III follow-up on labs continue supportive care 4. Lobar pneumonia treated we will continue present management   I have personally seen and evaluated the patient, evaluated laboratory and imaging results, formulated the assessment and plan and placed orders. The Patient requires high complexity decision making for assessment and support.  Case was discussed on Rounds with the Respiratory Therapy Staff  Allyne Gee, MD [facility] Pulmonary Critical Care Medicine Sleep Medicine

## 2019-03-14 DIAGNOSIS — J9621 Acute and chronic respiratory failure with hypoxia: Secondary | ICD-10-CM | POA: Diagnosis not present

## 2019-03-14 DIAGNOSIS — J449 Chronic obstructive pulmonary disease, unspecified: Secondary | ICD-10-CM | POA: Diagnosis not present

## 2019-03-14 DIAGNOSIS — N183 Chronic kidney disease, stage 3 (moderate): Secondary | ICD-10-CM | POA: Diagnosis not present

## 2019-03-14 DIAGNOSIS — J181 Lobar pneumonia, unspecified organism: Secondary | ICD-10-CM | POA: Diagnosis not present

## 2019-03-14 NOTE — Progress Notes (Signed)
Pulmonary Critical Care Medicine [place]   PULMONARY CRITICAL CARE SERVICE  PROGRESS NOTE  Date of Service: 9/79/8921  Angela Waters  JHE:[id number]  DOB: May 09, 1941   DOA: 03/07/2019  Referring Physician: Merton Border, MD  HPI: Angela Waters is a 78 y.o. female seen for follow up of Acute on Chronic Respiratory Failure.  Patient is on pressure support right now on 35% FiO2 good saturations are noted  Medications: Reviewed on Rounds  Physical Exam:  Vitals: Temperature 98.5 pulse 76 respiratory 11 blood pressure 106/76 saturations 99%  Ventilator Settings mode ventilation pressure support FiO2 35% pressure support 12 PEEP 5  . General: Comfortable at this time . Eyes: Grossly normal lids, irises & conjunctiva . ENT: grossly tongue is normal . Neck: no obvious mass . Cardiovascular: S1 S2 normal no gallop . Respiratory: No rhonchi no rales are noted at this time . Abdomen: soft . Skin: no rash seen on limited exam . Musculoskeletal: not rigid . Psychiatric:unable to assess . Neurologic: no seizure no involuntary movements         Lab Data:   Basic Metabolic Panel: Recent Labs  Lab 03/08/19 0514 03/09/19 0426 03/10/19 0721 03/11/19 0545 03/13/19 0637  NA 141 141 143 139 139  K 5.2* 4.7 5.0 4.6 4.8  CL 115* 112* 112* 109 105  CO2 20* 22 18* 24 27  GLUCOSE 110* 128* 111* 121* 116*  BUN 59* 55* 51* 53* 44*  CREATININE 1.80* 1.64* 1.54* 1.46* 1.36*  CALCIUM 8.7* 9.0 9.5 8.9 9.1  MG 2.4 2.4 2.3 2.1 2.0  PHOS 3.8 3.6 3.5 3.2 3.3    ABG: Recent Labs  Lab 03/08/19 0453 03/09/19 0530  PHART 7.272* 7.286*  PCO2ART 49.6* 50.3*  PO2ART 121* 79.2*  HCO3 22.2 23.2  O2SAT 98.3 94.3    Liver Function Tests: Recent Labs  Lab 03/08/19 0514 03/09/19 0426 03/10/19 0721 03/11/19 0545 03/13/19 0637  AST 13*  --   --   --   --   ALT 18  --   --   --   --   ALKPHOS 34*  --   --   --   --   BILITOT 0.4  --   --   --   --   PROT  5.2*  --   --   --   --   ALBUMIN 2.6* 3.0* 2.8* 2.4* 2.9*   No results for input(s): LIPASE, AMYLASE in the last 168 hours. No results for input(s): AMMONIA in the last 168 hours.  CBC: Recent Labs  Lab 03/08/19 0514 03/09/19 0426 03/10/19 0721 03/11/19 0545 03/13/19 0637  WBC 5.2 8.0 8.4 6.5 9.2  NEUTROABS 4.6  --   --   --   --   HGB 7.8* 9.2* 8.9* 7.4* 8.8*  HCT 25.1* 29.4* 27.0* 23.8* 28.1*  MCV 98.0 95.5 93.4 97.5 95.9  PLT 134* 172 164 163 193    Cardiac Enzymes: No results for input(s): CKTOTAL, CKMB, CKMBINDEX, TROPONINI in the last 168 hours.  BNP (last 3 results) No results for input(s): BNP in the last 8760 hours.  ProBNP (last 3 results) No results for input(s): PROBNP in the last 8760 hours.  Radiological Exams: No results found.  Assessment/Plan Active Problems:   Acute on chronic respiratory failure with hypoxia (HCC)   COPD, severe (HCC)   Chronic kidney disease, stage III (moderate) (HCC)   Lobar pneumonia, unspecified organism ([place])   1. Acute on chronic respiratory  failure with hypoxia we will continue with pressure support titrate oxygen continue pulmonary toilet 2. Severe COPD at baseline we will continue with supportive care 3. Chronic kidney disease stage III at baseline we will follow 4. Lobar pneumonia unspecified treated clinically improving   I have personally seen and evaluated the patient, evaluated laboratory and imaging results, formulated the assessment and plan and placed orders. The Patient requires high complexity decision making for assessment and support.  Case was discussed on Rounds with the Respiratory Therapy Staff  Yevonne PaxSaadat A Chananya Canizalez, MD [facility] Pulmonary Critical Care Medicine Sleep Medicine

## 2019-03-15 ENCOUNTER — Other Ambulatory Visit (HOSPITAL_COMMUNITY): Payer: Medicare Other

## 2019-03-15 DIAGNOSIS — J181 Lobar pneumonia, unspecified organism: Secondary | ICD-10-CM | POA: Diagnosis not present

## 2019-03-15 DIAGNOSIS — J449 Chronic obstructive pulmonary disease, unspecified: Secondary | ICD-10-CM | POA: Diagnosis not present

## 2019-03-15 DIAGNOSIS — N183 Chronic kidney disease, stage 3 (moderate): Secondary | ICD-10-CM | POA: Diagnosis not present

## 2019-03-15 DIAGNOSIS — J9621 Acute and chronic respiratory failure with hypoxia: Secondary | ICD-10-CM | POA: Diagnosis not present

## 2019-03-15 MED ORDER — GENERIC EXTERNAL MEDICATION
Status: DC
Start: ? — End: 2019-03-15

## 2019-03-15 NOTE — Progress Notes (Signed)
Pulmonary Critical Care Medicine [place]   PULMONARY CRITICAL CARE SERVICE  PROGRESS NOTE  Date of Service: 0/62/6948  Angela Waters  NIO:[id number]  DOB: Apr 28, 1941   DOA: 03/07/2019  Referring Physician: Merton Border, MD  HPI: Angela Waters is a 78 y.o. female seen for follow up of Acute on Chronic Respiratory Failure.  Patient is doing quite well she was placed on pressure support 5/5 for spontaneous breathing trial with excellent tidal volumes  Medications: Reviewed on Rounds  Physical Exam:  Vitals: Temperature 96.8 pulse 68 respiratory rate 13 blood pressure 175/80 saturations 97%  Ventilator Settings mode ventilation pressure support FiO2 20% tidal volume 545 per support 5 PEEP 5  . General: Comfortable at this time . Eyes: Grossly normal lids, irises & conjunctiva . ENT: grossly tongue is normal . Neck: no obvious mass . Cardiovascular: S1 S2 normal no gallop . Respiratory: No rhonchi no rales are noted at this time . Abdomen: soft . Skin: no rash seen on limited exam . Musculoskeletal: not rigid . Psychiatric:unable to assess . Neurologic: no seizure no involuntary movements         Lab Data:   Basic Metabolic Panel: Recent Labs  Lab 03/09/19 0426 03/10/19 0721 03/11/19 0545 03/13/19 0637  NA 141 143 139 139  K 4.7 5.0 4.6 4.8  CL 112* 112* 109 105  CO2 22 18* 24 27  GLUCOSE 128* 111* 121* 116*  BUN 55* 51* 53* 44*  CREATININE 1.64* 1.54* 1.46* 1.36*  CALCIUM 9.0 9.5 8.9 9.1  MG 2.4 2.3 2.1 2.0  PHOS 3.6 3.5 3.2 3.3    ABG: Recent Labs  Lab 03/09/19 0530  PHART 7.286*  PCO2ART 50.3*  PO2ART 79.2*  HCO3 23.2  O2SAT 94.3    Liver Function Tests: Recent Labs  Lab 03/09/19 0426 03/10/19 0721 03/11/19 0545 03/13/19 0637  ALBUMIN 3.0* 2.8* 2.4* 2.9*   No results for input(s): LIPASE, AMYLASE in the last 168 hours. No results for input(s): AMMONIA in the last 168 hours.  CBC: Recent Labs  Lab  03/09/19 0426 03/10/19 0721 03/11/19 0545 03/13/19 0637  WBC 8.0 8.4 6.5 9.2  HGB 9.2* 8.9* 7.4* 8.8*  HCT 29.4* 27.0* 23.8* 28.1*  MCV 95.5 93.4 97.5 95.9  PLT 172 164 163 193    Cardiac Enzymes: No results for input(s): CKTOTAL, CKMB, CKMBINDEX, TROPONINI in the last 168 hours.  BNP (last 3 results) No results for input(s): BNP in the last 8760 hours.  ProBNP (last 3 results) No results for input(s): PROBNP in the last 8760 hours.  Radiological Exams: No results found.  Assessment/Plan Active Problems:   Acute on chronic respiratory failure with hypoxia (HCC)   COPD, severe (HCC)   Chronic kidney disease, stage III (moderate) (HCC)   Lobar pneumonia, unspecified organism ([place])   1. Acute on chronic respiratory failure with hypoxia we will continue with the spontaneous breathing trial and hopefully give her an extubation trial today 2. Severe COPD at baseline we will continue present management 3. Chronic kidney disease stage III at baseline continue to follow 4. Lobar pneumonia treated we will follow-up radiologically clinically she is improved   I have personally seen and evaluated the patient, evaluated laboratory and imaging results, formulated the assessment and plan and placed orders. The Patient requires high complexity decision making for assessment and support.  Case was discussed on Rounds with the Respiratory Therapy Staff  Allyne Gee, MD [facility] Pulmonary Critical Care Medicine Sleep Medicine

## 2019-03-16 ENCOUNTER — Other Ambulatory Visit (HOSPITAL_COMMUNITY): Payer: Medicare Other

## 2019-03-16 DIAGNOSIS — J449 Chronic obstructive pulmonary disease, unspecified: Secondary | ICD-10-CM | POA: Diagnosis not present

## 2019-03-16 DIAGNOSIS — N183 Chronic kidney disease, stage 3 (moderate): Secondary | ICD-10-CM | POA: Diagnosis not present

## 2019-03-16 DIAGNOSIS — J181 Lobar pneumonia, unspecified organism: Secondary | ICD-10-CM | POA: Diagnosis not present

## 2019-03-16 DIAGNOSIS — J9621 Acute and chronic respiratory failure with hypoxia: Secondary | ICD-10-CM | POA: Diagnosis not present

## 2019-03-16 MED ORDER — GENERIC EXTERNAL MEDICATION
Status: DC
Start: ? — End: 2019-03-16

## 2019-03-16 NOTE — Progress Notes (Signed)
[facility]   PULMONARY CRITICAL CARE SERVICE  PROGRESS NOTE  Date of Service: 03/16/2019  Angela Dorylice Cassell Peddie  ZOX:[id number]  DOB: 01/24/1941   DOA: 03/07/2019  Referring Physician: Carron CurieAli Hijazi, MD  HPI: Angela Waters is a 78 y.o. female seen for follow up of Acute on Chronic Respiratory Failure.  Patient is extubated doing well is on the BiPAP this morning  Medications: Reviewed on Rounds  Physical Exam:  Vitals: Temperature 97.7 pulse 71 respiratory rate 11 blood pressure 132/72 saturations 99%  Ventilator Settings on BiPAP at this time doing well  . General: Comfortable at this time . Eyes: Grossly normal lids, irises & conjunctiva . ENT: grossly tongue is normal . Neck: no obvious mass . Cardiovascular: S1 S2 normal no gallop . Respiratory: No rhonchi no rales . Abdomen: soft . Skin: no rash seen on limited exam . Musculoskeletal: not rigid . Psychiatric:unable to assess . Neurologic: no seizure no involuntary movements         Lab Data:   Basic Metabolic Panel: Recent Labs  Lab 03/10/19 0721 03/11/19 0545 03/13/19 0637  NA 143 139 139  K 5.0 4.6 4.8  CL 112* 109 105  CO2 18* 24 27  GLUCOSE 111* 121* 116*  BUN 51* 53* 44*  CREATININE 1.54* 1.46* 1.36*  CALCIUM 9.5 8.9 9.1  MG 2.3 2.1 2.0  PHOS 3.5 3.2 3.3    ABG: No results for input(s): PHART, PCO2ART, PO2ART, HCO3, O2SAT in the last 168 hours.  Liver Function Tests: Recent Labs  Lab 03/10/19 0721 03/11/19 0545 03/13/19 0637  ALBUMIN 2.8* 2.4* 2.9*   No results for input(s): LIPASE, AMYLASE in the last 168 hours. No results for input(s): AMMONIA in the last 168 hours.  CBC: Recent Labs  Lab 03/10/19 0721 03/11/19 0545 03/13/19 0637  WBC 8.4 6.5 9.2  HGB 8.9* 7.4* 8.8*  HCT 27.0* 23.8* 28.1*  MCV 93.4 97.5 95.9  PLT 164 163 193    Cardiac Enzymes: No results for input(s): CKTOTAL, CKMB, CKMBINDEX, TROPONINI in the last 168  hours.  BNP (last 3 results) No results for input(s): BNP in the last 8760 hours.  ProBNP (last 3 results) No results for input(s): PROBNP in the last 8760 hours.  Radiological Exams: Dg Abd Portable 1v  Result Date: 03/15/2019 CLINICAL DATA:  Nasogastric tube placement. EXAM: PORTABLE ABDOMEN - 1 VIEW COMPARISON:  03/08/2019. FINDINGS: NG tube noted with tip over the stomach. Air-filled loops of small and large bowel noted. No free air. Bibasilar atelectasis/infiltrates. Lower lumbar spine fusion. IMPRESSION: 1.  NG tube noted with tip over the stomach. 2. Air-filled loops of small and large bowel noted. Adynamic ileus could present in this fashion. Follow-up exam suggested to demonstrate resolution to exclude bowel obstruction. Electronically Signed   By: Maisie Fushomas  Register   On: 03/15/2019 13:42    Assessment/Plan Active Problems:   Acute on chronic respiratory failure with hypoxia (HCC)   COPD, severe (HCC)   Chronic kidney disease, stage III (moderate) (HCC)   Lobar pneumonia, unspecified organism (HCC)   1. Acute on chronic respiratory failure with hypoxia we will continue with BiPAP therapy as ordered through today and then placed on nasal cannula.  She should continue to use the BiPAP at nighttime for sleep apnea 2. Severe COPD at baseline we will continue with supportive care nebulizers as necessary 3. Chronic kidney disease stage III we will continue to follow 4. Lobar pneumonia unspecified treated clinically  improving we will continue to follow   I have personally seen and evaluated the patient, evaluated laboratory and imaging results, formulated the assessment and plan and placed orders. The Patient requires high complexity decision making for assessment and support.  Case was discussed on Rounds with the Respiratory Therapy Staff  Allyne Gee, MD [facility] Pulmonary Critical Care Medicine Sleep Medicine

## 2019-03-17 LAB — BASIC METABOLIC PANEL
Anion gap: 8 (ref 5–15)
BUN: 25 mg/dL — ABNORMAL HIGH (ref 8–23)
CO2: 31 mmol/L (ref 22–32)
Calcium: 8.8 mg/dL — ABNORMAL LOW (ref 8.9–10.3)
Chloride: 101 mmol/L (ref 98–111)
Creatinine, Ser: 1.19 mg/dL — ABNORMAL HIGH (ref 0.44–1.00)
GFR calc Af Amer: 51 mL/min — ABNORMAL LOW (ref >60)
GFR calc non Af Amer: 44 mL/min — ABNORMAL LOW (ref >60)
Glucose, Bld: 89 mg/dL (ref 70–99)
Potassium: 3.4 mmol/L — ABNORMAL LOW (ref 3.5–5.1)
Sodium: 140 mmol/L (ref 135–145)

## 2019-03-17 LAB — CBC
HCT: 25 % — ABNORMAL LOW (ref 36.0–46.0)
Hemoglobin: 7.9 g/dL — ABNORMAL LOW (ref 12.0–15.0)
MCH: 29.7 pg (ref 26.0–34.0)
MCHC: 31.6 g/dL (ref 30.0–36.0)
MCV: 94 fL (ref 80.0–100.0)
Platelets: 192 10*3/uL (ref 150–400)
RBC: 2.66 MIL/uL — ABNORMAL LOW (ref 3.87–5.11)
RDW: 13.5 % (ref 11.5–15.5)
WBC: 7.7 10*3/uL (ref 4.0–10.5)
nRBC: 0 % (ref 0.0–0.2)

## 2019-03-17 LAB — MAGNESIUM: Magnesium: 1.7 mg/dL (ref 1.7–2.4)

## 2019-03-18 LAB — MAGNESIUM: Magnesium: 2 mg/dL (ref 1.7–2.4)

## 2019-03-18 LAB — POTASSIUM: Potassium: 3.5 mmol/L (ref 3.5–5.1)

## 2019-03-20 LAB — CBC
HCT: 25.7 % — ABNORMAL LOW (ref 36.0–46.0)
Hemoglobin: 8.1 g/dL — ABNORMAL LOW (ref 12.0–15.0)
MCH: 30.2 pg (ref 26.0–34.0)
MCHC: 31.5 g/dL (ref 30.0–36.0)
MCV: 95.9 fL (ref 80.0–100.0)
Platelets: 167 10*3/uL (ref 150–400)
RBC: 2.68 MIL/uL — ABNORMAL LOW (ref 3.87–5.11)
RDW: 13.6 % (ref 11.5–15.5)
WBC: 5.7 10*3/uL (ref 4.0–10.5)
nRBC: 0 % (ref 0.0–0.2)

## 2019-03-20 LAB — BASIC METABOLIC PANEL
Anion gap: 9 (ref 5–15)
BUN: 9 mg/dL (ref 8–23)
CO2: 34 mmol/L — ABNORMAL HIGH (ref 22–32)
Calcium: 9 mg/dL (ref 8.9–10.3)
Chloride: 99 mmol/L (ref 98–111)
Creatinine, Ser: 1.01 mg/dL — ABNORMAL HIGH (ref 0.44–1.00)
GFR calc Af Amer: >60 mL/min (ref >60)
GFR calc non Af Amer: 53 mL/min — ABNORMAL LOW (ref >60)
Glucose, Bld: 91 mg/dL (ref 70–99)
Potassium: 3.5 mmol/L (ref 3.5–5.1)
Sodium: 142 mmol/L (ref 135–145)

## 2019-03-20 LAB — MAGNESIUM: Magnesium: 1.9 mg/dL (ref 1.7–2.4)

## 2019-03-21 ENCOUNTER — Other Ambulatory Visit (HOSPITAL_COMMUNITY): Payer: Medicare Other

## 2019-03-21 MED ORDER — GENERIC EXTERNAL MEDICATION
Status: DC
Start: ? — End: 2019-03-21

## 2019-03-22 LAB — CBC
HCT: 26.3 % — ABNORMAL LOW (ref 36.0–46.0)
Hemoglobin: 8.3 g/dL — ABNORMAL LOW (ref 12.0–15.0)
MCH: 30.3 pg (ref 26.0–34.0)
MCHC: 31.6 g/dL (ref 30.0–36.0)
MCV: 96 fL (ref 80.0–100.0)
Platelets: 153 10*3/uL (ref 150–400)
RBC: 2.74 MIL/uL — ABNORMAL LOW (ref 3.87–5.11)
RDW: 13.6 % (ref 11.5–15.5)
WBC: 5.6 10*3/uL (ref 4.0–10.5)
nRBC: 0 % (ref 0.0–0.2)

## 2019-03-22 LAB — BASIC METABOLIC PANEL
Anion gap: 9 (ref 5–15)
BUN: 15 mg/dL (ref 8–23)
CO2: 35 mmol/L — ABNORMAL HIGH (ref 22–32)
Calcium: 9.1 mg/dL (ref 8.9–10.3)
Chloride: 94 mmol/L — ABNORMAL LOW (ref 98–111)
Creatinine, Ser: 1.03 mg/dL — ABNORMAL HIGH (ref 0.44–1.00)
GFR calc Af Amer: >60 mL/min (ref >60)
GFR calc non Af Amer: 52 mL/min — ABNORMAL LOW (ref >60)
Glucose, Bld: 111 mg/dL — ABNORMAL HIGH (ref 70–99)
Potassium: 3.6 mmol/L (ref 3.5–5.1)
Sodium: 138 mmol/L (ref 135–145)

## 2019-03-22 LAB — MAGNESIUM: Magnesium: 1.7 mg/dL (ref 1.7–2.4)

## 2019-03-22 LAB — PHOSPHORUS: Phosphorus: 3.4 mg/dL (ref 2.5–4.6)

## 2019-03-27 LAB — CBC
HCT: 26.4 % — ABNORMAL LOW (ref 36.0–46.0)
Hemoglobin: 8.4 g/dL — ABNORMAL LOW (ref 12.0–15.0)
MCH: 30.1 pg (ref 26.0–34.0)
MCHC: 31.8 g/dL (ref 30.0–36.0)
MCV: 94.6 fL (ref 80.0–100.0)
Platelets: 178 10*3/uL (ref 150–400)
RBC: 2.79 MIL/uL — ABNORMAL LOW (ref 3.87–5.11)
RDW: 13.2 % (ref 11.5–15.5)
WBC: 6.5 10*3/uL (ref 4.0–10.5)
nRBC: 0 % (ref 0.0–0.2)

## 2019-03-27 LAB — BASIC METABOLIC PANEL
Anion gap: 10 (ref 5–15)
BUN: 17 mg/dL (ref 8–23)
CO2: 34 mmol/L — ABNORMAL HIGH (ref 22–32)
Calcium: 8.9 mg/dL (ref 8.9–10.3)
Chloride: 95 mmol/L — ABNORMAL LOW (ref 98–111)
Creatinine, Ser: 1.22 mg/dL — ABNORMAL HIGH (ref 0.44–1.00)
GFR calc Af Amer: 49 mL/min — ABNORMAL LOW (ref >60)
GFR calc non Af Amer: 42 mL/min — ABNORMAL LOW (ref >60)
Glucose, Bld: 79 mg/dL (ref 70–99)
Potassium: 4.3 mmol/L (ref 3.5–5.1)
Sodium: 139 mmol/L (ref 135–145)

## 2019-03-27 LAB — PHOSPHORUS: Phosphorus: 4.5 mg/dL (ref 2.5–4.6)

## 2019-03-27 LAB — MAGNESIUM: Magnesium: 1.8 mg/dL (ref 1.7–2.4)

## 2019-03-30 LAB — BASIC METABOLIC PANEL
Anion gap: 9 (ref 5–15)
BUN: 21 mg/dL (ref 8–23)
CO2: 32 mmol/L (ref 22–32)
Calcium: 9 mg/dL (ref 8.9–10.3)
Chloride: 96 mmol/L — ABNORMAL LOW (ref 98–111)
Creatinine, Ser: 1.15 mg/dL — ABNORMAL HIGH (ref 0.44–1.00)
GFR calc Af Amer: 53 mL/min — ABNORMAL LOW (ref >60)
GFR calc non Af Amer: 46 mL/min — ABNORMAL LOW (ref >60)
Glucose, Bld: 85 mg/dL (ref 70–99)
Potassium: 4.7 mmol/L (ref 3.5–5.1)
Sodium: 137 mmol/L (ref 135–145)

## 2019-03-30 LAB — MAGNESIUM: Magnesium: 2 mg/dL (ref 1.7–2.4)

## 2019-04-01 LAB — CBC
HCT: 29 % — ABNORMAL LOW (ref 36.0–46.0)
Hemoglobin: 8.9 g/dL — ABNORMAL LOW (ref 12.0–15.0)
MCH: 30 pg (ref 26.0–34.0)
MCHC: 30.7 g/dL (ref 30.0–36.0)
MCV: 97.6 fL (ref 80.0–100.0)
Platelets: 176 10*3/uL (ref 150–400)
RBC: 2.97 MIL/uL — ABNORMAL LOW (ref 3.87–5.11)
RDW: 13.5 % (ref 11.5–15.5)
WBC: 5.2 10*3/uL (ref 4.0–10.5)
nRBC: 0 % (ref 0.0–0.2)

## 2019-04-01 LAB — BASIC METABOLIC PANEL
Anion gap: 9 (ref 5–15)
BUN: 21 mg/dL (ref 8–23)
CO2: 32 mmol/L (ref 22–32)
Calcium: 9 mg/dL (ref 8.9–10.3)
Chloride: 96 mmol/L — ABNORMAL LOW (ref 98–111)
Creatinine, Ser: 1.21 mg/dL — ABNORMAL HIGH (ref 0.44–1.00)
GFR calc Af Amer: 50 mL/min — ABNORMAL LOW (ref >60)
GFR calc non Af Amer: 43 mL/min — ABNORMAL LOW (ref >60)
Glucose, Bld: 104 mg/dL — ABNORMAL HIGH (ref 70–99)
Potassium: 4.2 mmol/L (ref 3.5–5.1)
Sodium: 137 mmol/L (ref 135–145)

## 2019-04-01 LAB — MAGNESIUM: Magnesium: 2 mg/dL (ref 1.7–2.4)

## 2019-04-04 LAB — MAGNESIUM: Magnesium: 2.1 mg/dL (ref 1.7–2.4)

## 2019-04-04 LAB — CBC
HCT: 30 % — ABNORMAL LOW (ref 36.0–46.0)
Hemoglobin: 9.4 g/dL — ABNORMAL LOW (ref 12.0–15.0)
MCH: 29.9 pg (ref 26.0–34.0)
MCHC: 31.3 g/dL (ref 30.0–36.0)
MCV: 95.5 fL (ref 80.0–100.0)
Platelets: 185 10*3/uL (ref 150–400)
RBC: 3.14 MIL/uL — ABNORMAL LOW (ref 3.87–5.11)
RDW: 13.5 % (ref 11.5–15.5)
WBC: 6 10*3/uL (ref 4.0–10.5)
nRBC: 0 % (ref 0.0–0.2)

## 2019-04-04 LAB — BASIC METABOLIC PANEL
Anion gap: 9 (ref 5–15)
BUN: 17 mg/dL (ref 8–23)
CO2: 30 mmol/L (ref 22–32)
Calcium: 9 mg/dL (ref 8.9–10.3)
Chloride: 98 mmol/L (ref 98–111)
Creatinine, Ser: 1.22 mg/dL — ABNORMAL HIGH (ref 0.44–1.00)
GFR calc Af Amer: 49 mL/min — ABNORMAL LOW (ref >60)
GFR calc non Af Amer: 42 mL/min — ABNORMAL LOW (ref >60)
Glucose, Bld: 93 mg/dL (ref 70–99)
Potassium: 4.5 mmol/L (ref 3.5–5.1)
Sodium: 137 mmol/L (ref 135–145)

## 2020-10-17 IMAGING — DX PORTABLE ABDOMEN - 1 VIEW
1 series · 1 of 1 positions shown · non-contrast
Comparison: None.

CLINICAL DATA: 78-year-old female with respiratory failure status
post NG tube placement.

EXAM:
PORTABLE CHEST 1 VIEW

[abdomen kub]
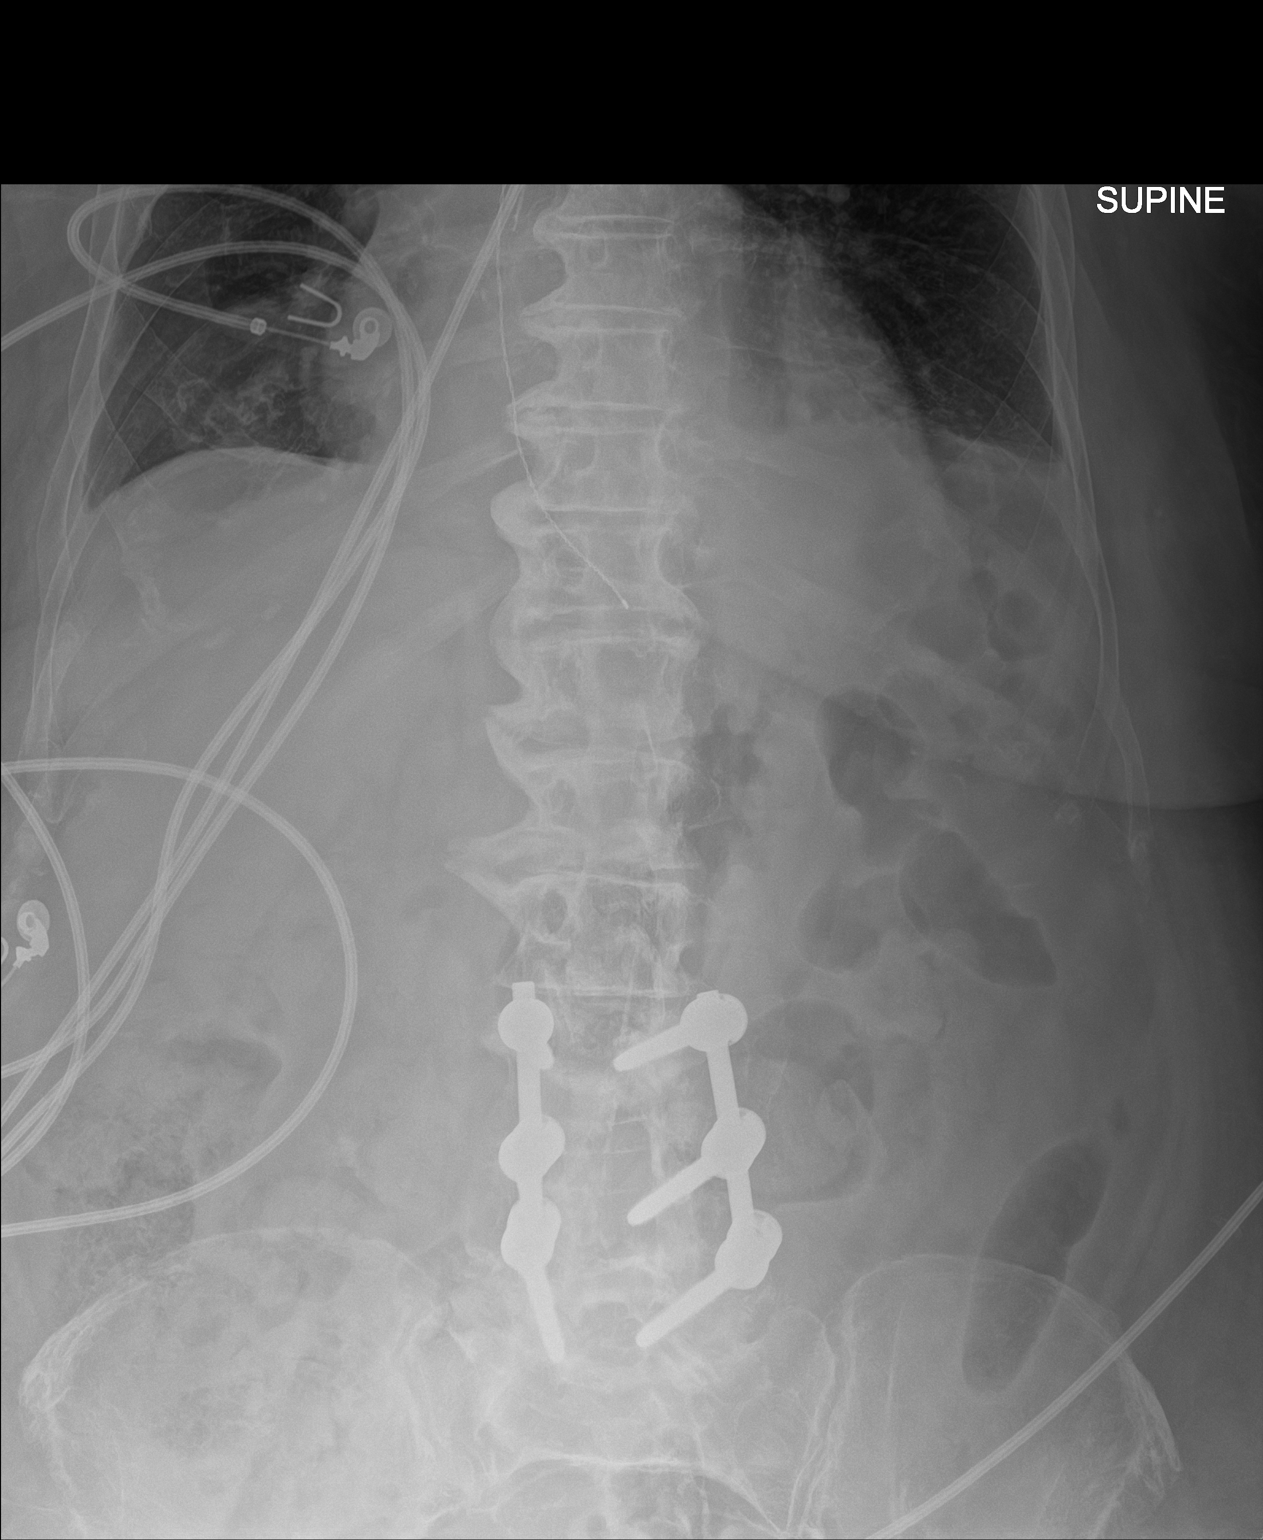

[1 of 1 positions shown; findings below may reference images not displayed]

FINDINGS: Endotracheal tube with tip approximately 3.5 cm above the carina. A
feeding tube extends down over the mediastinum with tip at or just
distal to the expected location of the GE junction. Recommend
further advancing of the tube by additional 7-10 cm. An additional
radiopaque line over the mediastinum to the right of the midthoracic
spine noted which is indeterminate. Repeat radiograph of the chest
and abdomen after repositioning of the feeding tube is recommended.

There are bibasilar atelectatic changes. Trace bilateral pleural
effusions may be present. No pneumothorax. There is mild
cardiomegaly. Atherosclerotic calcification of the aorta. Osteopenia
with degenerative changes of the spine. Cervical spine and lumbar
spine fusion hardware. No acute osseous pathology.
IMPRESSION: 1. Endotracheal tube above the carina.
2. Feeding tube with tip at or just distal to the expected location
of the GE junction. Recommend further advancing the tube by
additional 7-10 cm.
3. Mild cardiomegaly. Probable trace bilateral pleural effusions.

## 2020-10-17 IMAGING — DX PORTABLE CHEST - 1 VIEW
1 series · 1 of 1 positions shown · non-contrast
Comparison: None.

CLINICAL DATA: 78-year-old female with respiratory failure status
post NG tube placement.

EXAM:
PORTABLE CHEST 1 VIEW

[chest ap]
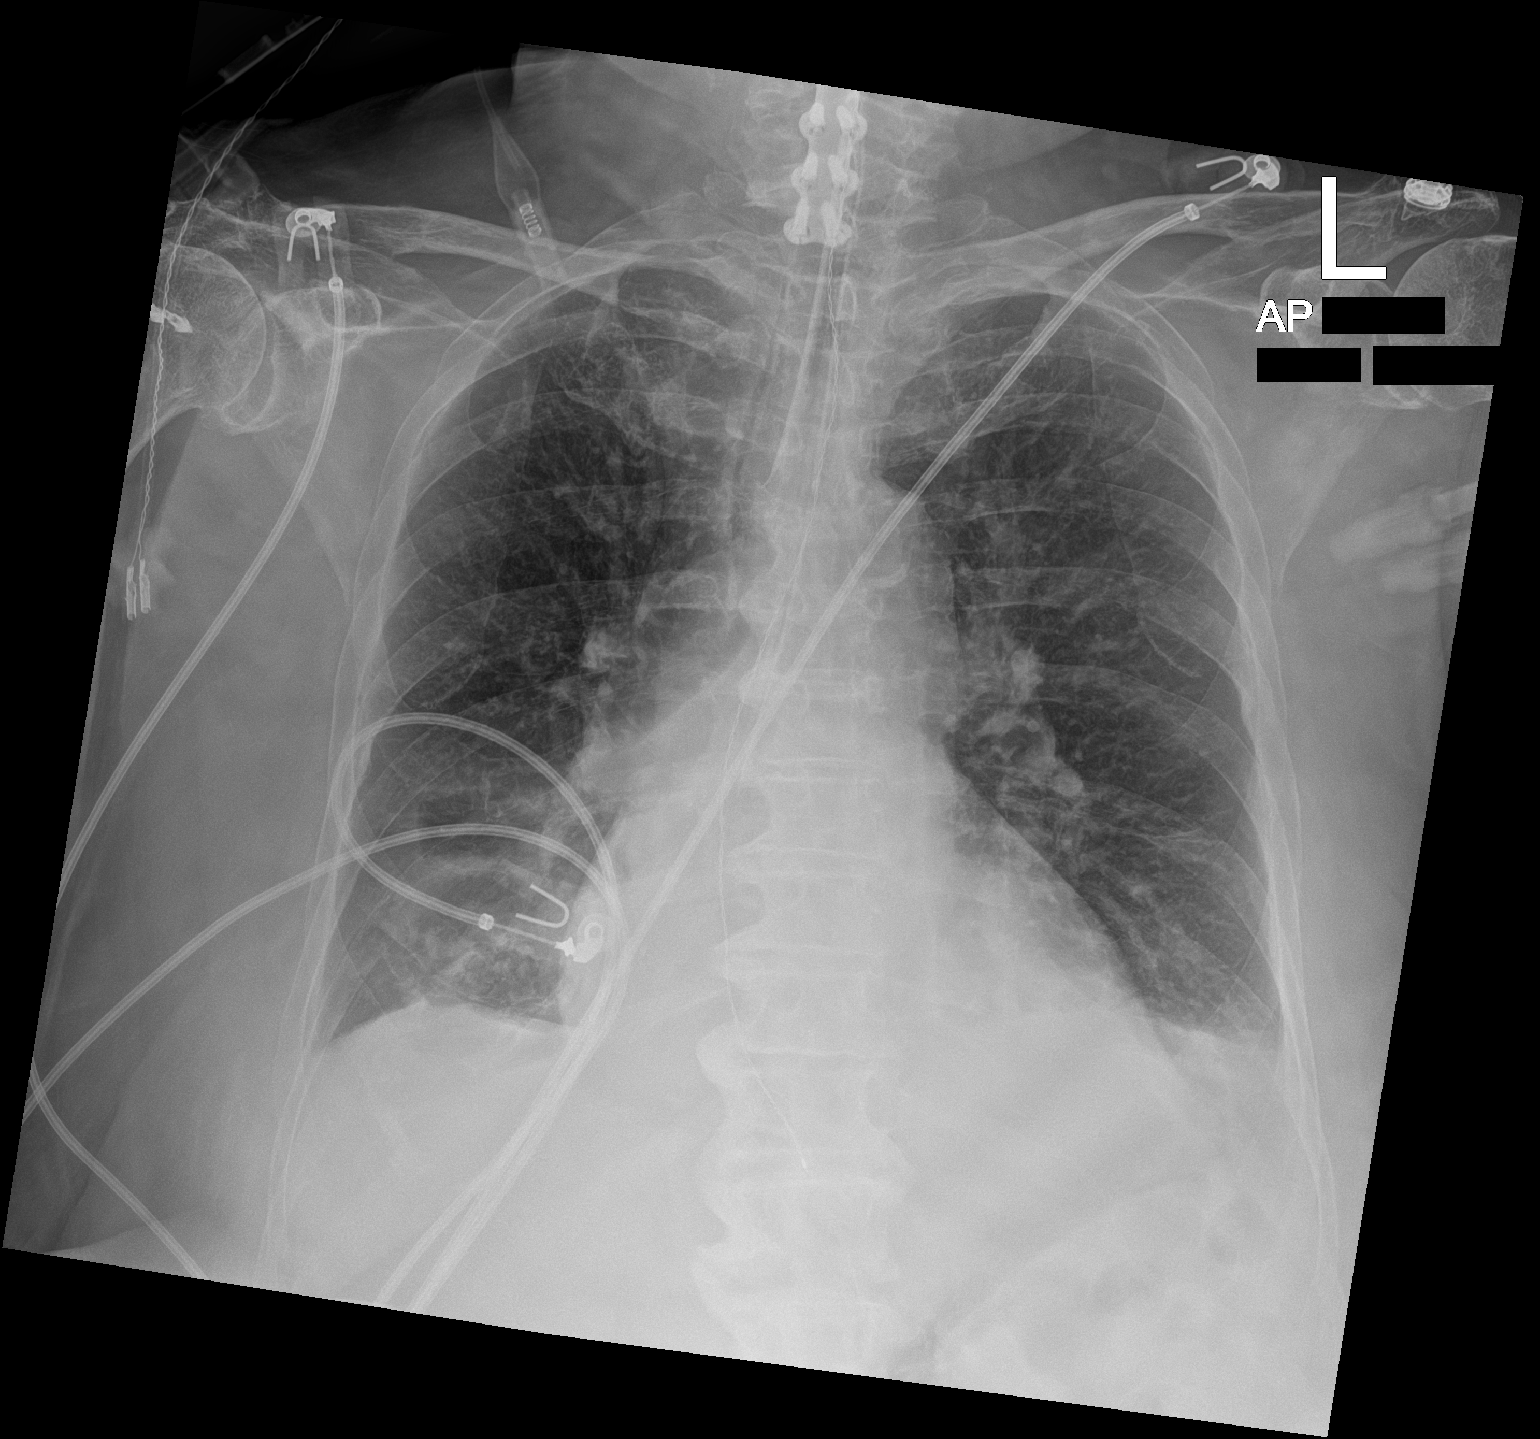

[1 of 1 positions shown; findings below may reference images not displayed]

FINDINGS: Endotracheal tube with tip approximately 3.5 cm above the carina. A
feeding tube extends down over the mediastinum with tip at or just
distal to the expected location of the GE junction. Recommend
further advancing of the tube by additional 7-10 cm. An additional
radiopaque line over the mediastinum to the right of the midthoracic
spine noted which is indeterminate. Repeat radiograph of the chest
and abdomen after repositioning of the feeding tube is recommended.

There are bibasilar atelectatic changes. Trace bilateral pleural
effusions may be present. No pneumothorax. There is mild
cardiomegaly. Atherosclerotic calcification of the aorta. Osteopenia
with degenerative changes of the spine. Cervical spine and lumbar
spine fusion hardware. No acute osseous pathology.
IMPRESSION: 1. Endotracheal tube above the carina.
2. Feeding tube with tip at or just distal to the expected location
of the GE junction. Recommend further advancing the tube by
additional 7-10 cm.
3. Mild cardiomegaly. Probable trace bilateral pleural effusions.

## 2020-10-18 IMAGING — DX PORTABLE ABDOMEN - 1 VIEW
1 series · 1 of 1 positions shown · non-contrast
Comparison: Radiograph yesterday.

CLINICAL DATA: OG tube placement.

EXAM:
PORTABLE ABDOMEN - 1 VIEW

[abdomen kub]
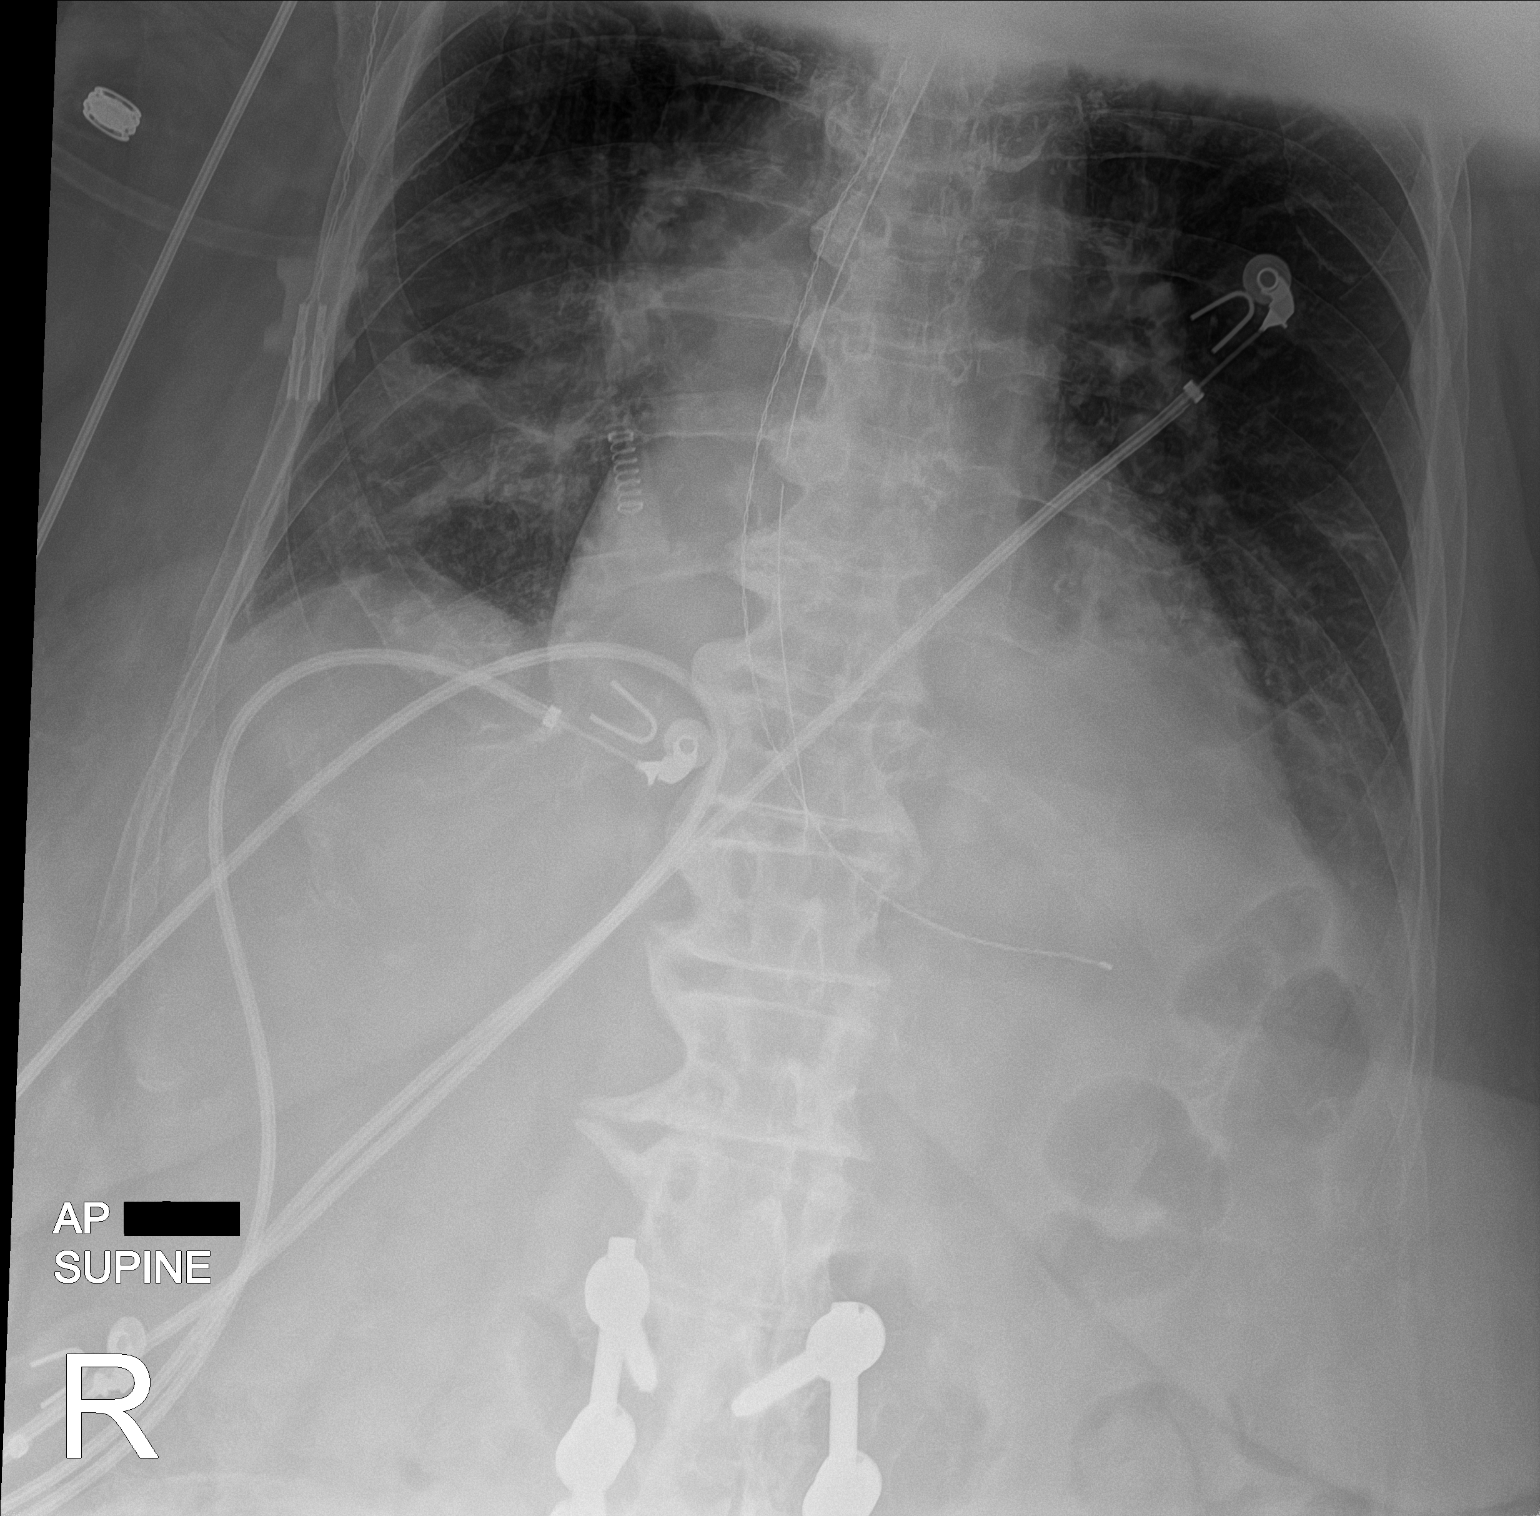

[1 of 1 positions shown; findings below may reference images not displayed]

FINDINGS: Tip of the enteric tube is in the region of the distal esophagus,
side-port in the mid chest. Recommend advancement of least 10 cm.
Additional esophageal stylette or temperature probe tip is in the
stomach. Increasing heterogeneous lower lobe opacities.
IMPRESSION: Tip of the enteric tube in the distal esophagus, side-port in the
mid esophagus. Recommend advancement of least 10 cm. Additional
esophageal stylette or temperature probe tip is in the stomach.

## 2020-10-26 IMAGING — DX ABDOMEN - 1 VIEW
2 series · 2 of 2 positions shown · non-contrast
Comparison: March 15, 2019

CLINICAL DATA: Ileus

EXAM:
ABDOMEN - 1 VIEW

[abdomen kub (1 of 2)]
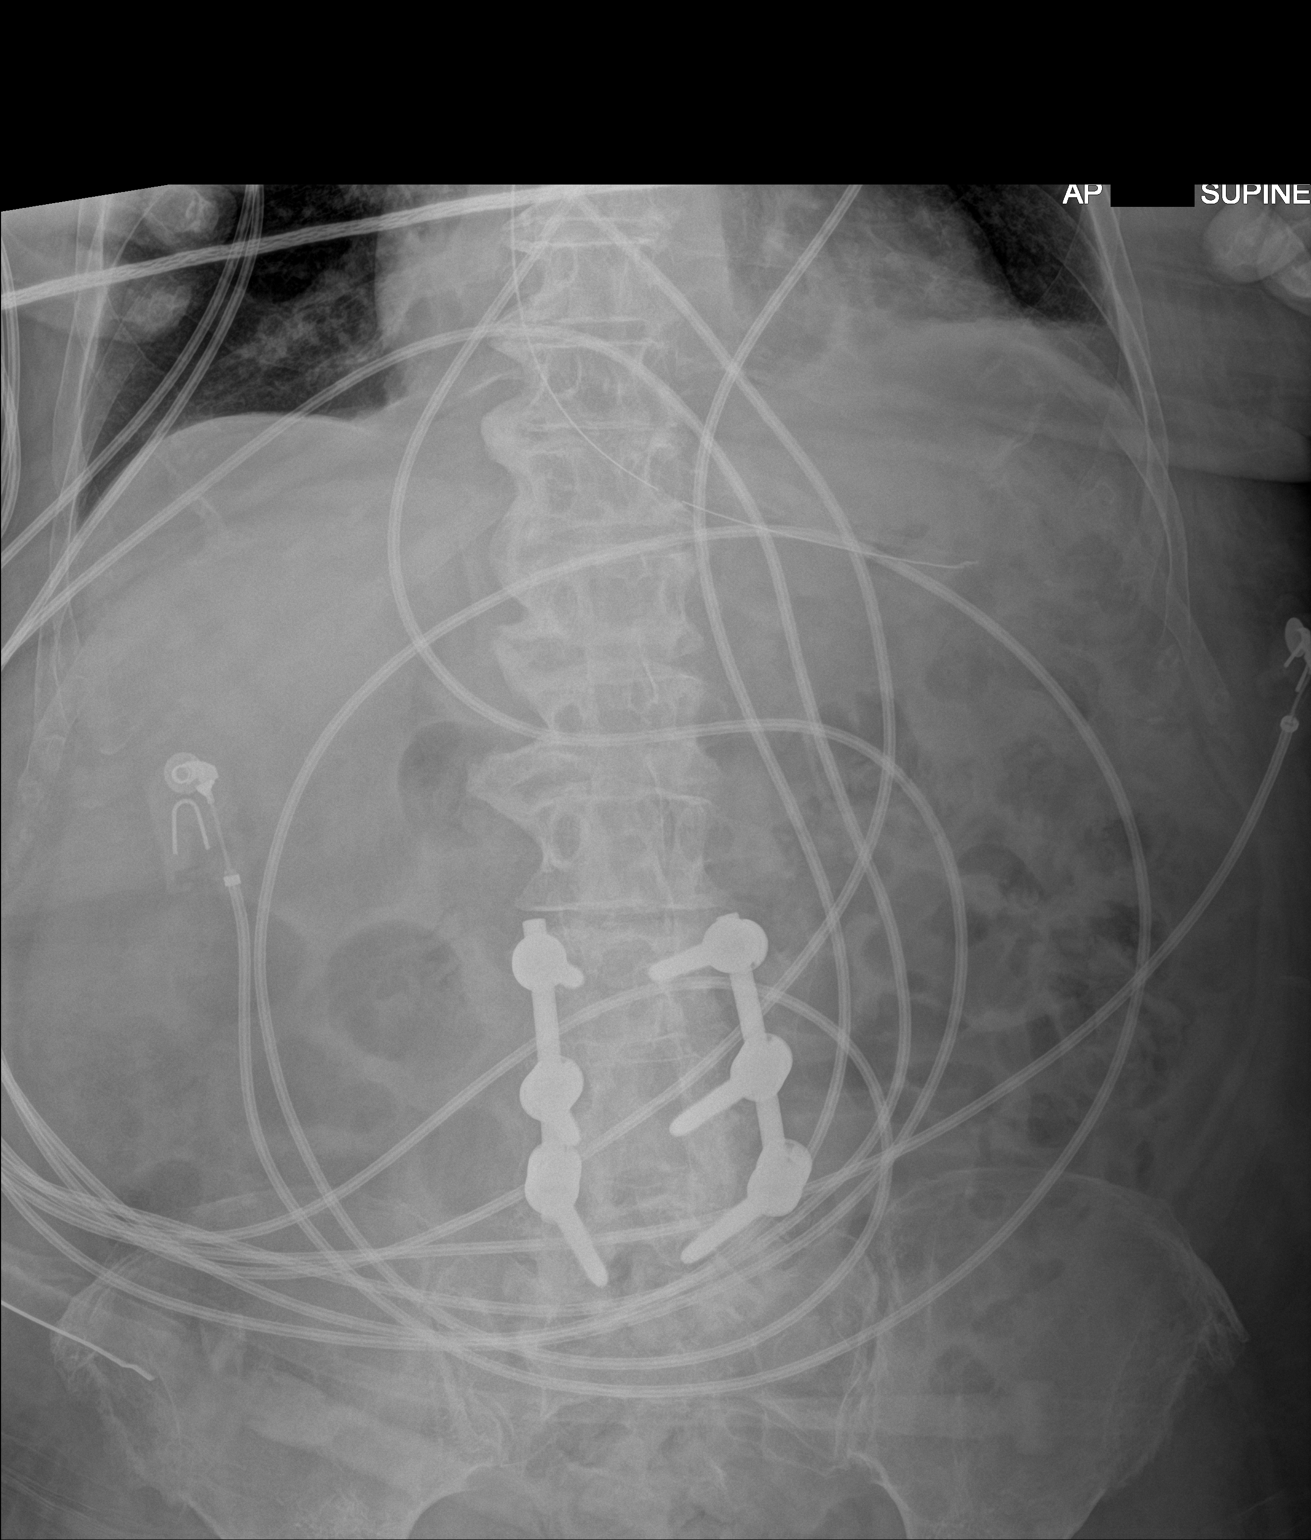

[abdomen kub (2 of 2)]
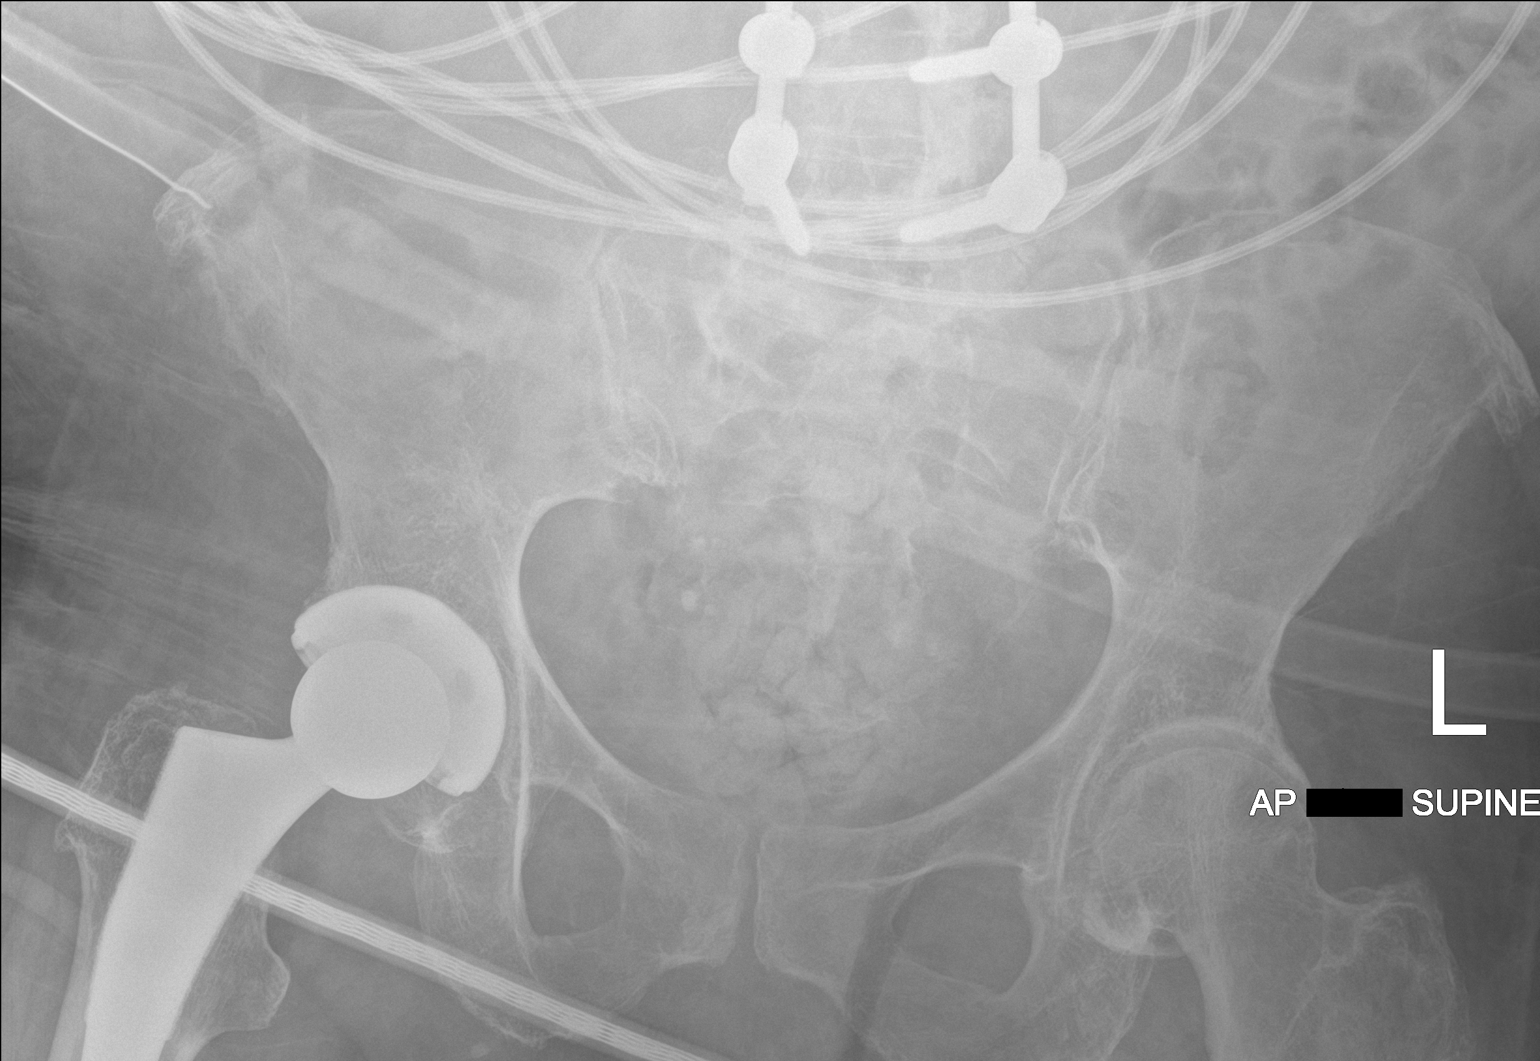

[2 of 2 positions shown; findings below may reference images not displayed]

FINDINGS: NG tube is seen projecting over the proximal stomach. Air and stool
seen in nondilated loops of bowel to the level of the rectum.
Moderate amount of rectal stool is noted. No abnormal calcifications
are seen. Overlying lumbar spine fixation hardware and right total
hip arthroplasty seen. Diffuse osteopenia noted.
IMPRESSION: Nonobstructive bowel gas pattern.

## 2021-08-17 DEATH — deceased
